# Patient Record
Sex: Female | Born: 1935 | Race: White | Hispanic: No | Marital: Married | Smoking: Never smoker
Health system: Southern US, Community
[De-identification: ages and names within clinical notes are randomized; demographics above are authoritative.]

## PROBLEM LIST (undated history)

## (undated) DIAGNOSIS — I10 Essential (primary) hypertension: Secondary | ICD-10-CM

## (undated) DIAGNOSIS — G309 Alzheimer's disease, unspecified: Secondary | ICD-10-CM

## (undated) DIAGNOSIS — R011 Cardiac murmur, unspecified: Secondary | ICD-10-CM

## (undated) DIAGNOSIS — E079 Disorder of thyroid, unspecified: Secondary | ICD-10-CM

## (undated) DIAGNOSIS — F0280 Dementia in other diseases classified elsewhere without behavioral disturbance: Secondary | ICD-10-CM

## (undated) HISTORY — DX: Alzheimer's disease, unspecified: G30.9

## (undated) HISTORY — DX: Dementia in other diseases classified elsewhere, unspecified severity, without behavioral disturbance, psychotic disturbance, mood disturbance, and anxiety: F02.80

---
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
## 1998-02-12 ENCOUNTER — Other Ambulatory Visit: Admission: RE | Admit: 1998-02-12 | Discharge: 1998-02-12 | Payer: Self-pay | Admitting: Family Medicine

## 1998-03-31 ENCOUNTER — Encounter: Admission: RE | Admit: 1998-03-31 | Discharge: 1998-06-29 | Payer: Self-pay | Admitting: Neurosurgery

## 1998-06-04 ENCOUNTER — Other Ambulatory Visit: Admission: RE | Admit: 1998-06-04 | Discharge: 1998-06-04 | Payer: Self-pay | Admitting: Family Medicine

## 1999-07-22 ENCOUNTER — Other Ambulatory Visit: Admission: RE | Admit: 1999-07-22 | Discharge: 1999-07-22 | Payer: Self-pay | Admitting: Family Medicine

## 2002-01-30 ENCOUNTER — Emergency Department (HOSPITAL_COMMUNITY): Admission: EM | Admit: 2002-01-30 | Discharge: 2002-01-30 | Payer: Self-pay | Admitting: Emergency Medicine

## 2002-01-30 ENCOUNTER — Encounter: Payer: Self-pay | Admitting: Emergency Medicine

## 2003-06-26 ENCOUNTER — Encounter: Payer: Self-pay | Admitting: Emergency Medicine

## 2003-06-26 ENCOUNTER — Emergency Department (HOSPITAL_COMMUNITY): Admission: EM | Admit: 2003-06-26 | Discharge: 2003-06-26 | Payer: Self-pay | Admitting: Emergency Medicine

## 2006-01-18 ENCOUNTER — Ambulatory Visit: Payer: Self-pay | Admitting: Family Medicine

## 2006-10-08 ENCOUNTER — Emergency Department (HOSPITAL_COMMUNITY): Admission: EM | Admit: 2006-10-08 | Discharge: 2006-10-08 | Payer: Self-pay | Admitting: Emergency Medicine

## 2006-10-16 ENCOUNTER — Inpatient Hospital Stay (HOSPITAL_COMMUNITY): Admission: EM | Admit: 2006-10-16 | Discharge: 2006-10-18 | Payer: Self-pay | Admitting: Emergency Medicine

## 2008-10-30 ENCOUNTER — Emergency Department (HOSPITAL_COMMUNITY): Admission: EM | Admit: 2008-10-30 | Discharge: 2008-10-30 | Payer: Self-pay | Admitting: Emergency Medicine

## 2010-02-22 ENCOUNTER — Emergency Department (HOSPITAL_COMMUNITY): Admission: EM | Admit: 2010-02-22 | Discharge: 2010-02-22 | Payer: Self-pay | Admitting: Emergency Medicine

## 2010-02-25 ENCOUNTER — Inpatient Hospital Stay (HOSPITAL_COMMUNITY): Admission: EM | Admit: 2010-02-25 | Discharge: 2010-02-27 | Payer: Self-pay | Admitting: Internal Medicine

## 2010-09-19 ENCOUNTER — Emergency Department (HOSPITAL_COMMUNITY): Admission: EM | Admit: 2010-09-19 | Discharge: 2010-09-19 | Payer: Self-pay | Admitting: Emergency Medicine

## 2011-01-09 ENCOUNTER — Emergency Department (HOSPITAL_COMMUNITY): Payer: Medicare Other

## 2011-01-09 ENCOUNTER — Emergency Department (HOSPITAL_COMMUNITY)
Admission: EM | Admit: 2011-01-09 | Discharge: 2011-01-09 | Disposition: A | Discharge status: Home / Self Care | Payer: Medicare Other | Attending: Emergency Medicine | Admitting: Emergency Medicine

## 2011-01-09 ENCOUNTER — Encounter (HOSPITAL_COMMUNITY): Payer: Self-pay

## 2011-01-09 DIAGNOSIS — R0789 Other chest pain: Secondary | ICD-10-CM | POA: Insufficient documentation

## 2011-01-09 DIAGNOSIS — I440 Atrioventricular block, first degree: Secondary | ICD-10-CM | POA: Insufficient documentation

## 2011-01-09 DIAGNOSIS — R0602 Shortness of breath: Secondary | ICD-10-CM | POA: Insufficient documentation

## 2011-01-09 DIAGNOSIS — I10 Essential (primary) hypertension: Secondary | ICD-10-CM | POA: Insufficient documentation

## 2011-01-09 DIAGNOSIS — F0390 Unspecified dementia without behavioral disturbance: Secondary | ICD-10-CM | POA: Insufficient documentation

## 2011-01-09 DIAGNOSIS — R1012 Left upper quadrant pain: Secondary | ICD-10-CM | POA: Insufficient documentation

## 2011-01-09 DIAGNOSIS — E7800 Pure hypercholesterolemia, unspecified: Secondary | ICD-10-CM | POA: Insufficient documentation

## 2011-01-09 DIAGNOSIS — E119 Type 2 diabetes mellitus without complications: Secondary | ICD-10-CM | POA: Insufficient documentation

## 2011-01-09 LAB — COMPREHENSIVE METABOLIC PANEL
BUN: 13 mg/dL (ref 6–23)
CO2: 25 mEq/L (ref 19–32)
Calcium: 8.8 mg/dL (ref 8.4–10.5)
Creatinine, Ser: 1.16 mg/dL (ref 0.4–1.2)
Sodium: 137 mEq/L (ref 135–145)

## 2011-01-09 LAB — URINE MICROSCOPIC-ADD ON

## 2011-01-09 LAB — POCT CARDIAC MARKERS
CKMB, poc: <1 ng/mL — ABNORMAL LOW (ref 1.0–8.0)
CKMB, poc: <1 ng/mL — ABNORMAL LOW (ref 1.0–8.0)
Myoglobin, poc: 79.7 ng/mL (ref 12–200)
Myoglobin, poc: 60.5 ng/mL (ref 12–200)

## 2011-01-09 LAB — URINALYSIS, ROUTINE W REFLEX MICROSCOPIC
Bilirubin Urine: NEGATIVE
Glucose, UA: NEGATIVE mg/dL
Hgb urine dipstick: NEGATIVE
Ketones, ur: NEGATIVE mg/dL
Nitrite: NEGATIVE
Specific Gravity, Urine: 1.009 (ref 1.005–1.030)

## 2011-01-09 LAB — DIFFERENTIAL
Lymphs Abs: 1.7 10*3/uL (ref 0.7–4.0)
Monocytes Absolute: 0.6 10*3/uL (ref 0.1–1.0)
Monocytes Relative: 8 % (ref 3–12)
Neutro Abs: 4.9 10*3/uL (ref 1.7–7.7)
Neutrophils Relative %: 68 % (ref 43–77)

## 2011-01-09 LAB — CBC
MCHC: 31.8 g/dL (ref 30.0–36.0)
MCV: 85.3 fL (ref 78.0–100.0)
RDW: 13.7 % (ref 11.5–15.5)

## 2011-01-09 MED ORDER — IOHEXOL 300 MG/ML  SOLN
100.0000 mL | Freq: Once | INTRAMUSCULAR | Status: AC | PRN
  Administered 2011-01-09: 100 mL via INTRAVENOUS

## 2011-01-11 LAB — URINE CULTURE
Colony Count: 60000
Culture  Setup Time: 201203040108

## 2011-01-19 LAB — POCT I-STAT, CHEM 8
Chloride: 102 mEq/L (ref 96–112)
Glucose, Bld: 165 mg/dL — ABNORMAL HIGH (ref 70–99)
Potassium: 4 mEq/L (ref 3.5–5.1)
Sodium: 137 mEq/L (ref 135–145)

## 2011-01-19 LAB — GLUCOSE, CAPILLARY: Glucose-Capillary: 217 mg/dL — ABNORMAL HIGH (ref 70–99)

## 2011-01-26 LAB — COMPREHENSIVE METABOLIC PANEL
ALT: 17 U/L (ref 0–35)
AST: 26 U/L (ref 0–37)
Albumin: 3.1 g/dL — ABNORMAL LOW (ref 3.5–5.2)
Albumin: 3 g/dL — ABNORMAL LOW (ref 3.5–5.2)
Alkaline Phosphatase: 83 U/L (ref 39–117)
Alkaline Phosphatase: 71 U/L (ref 39–117)
BUN: 15 mg/dL (ref 6–23)
Calcium: 9 mg/dL (ref 8.4–10.5)
Calcium: 8.9 mg/dL (ref 8.4–10.5)
Creatinine, Ser: 0.97 mg/dL (ref 0.4–1.2)
Glucose, Bld: 175 mg/dL — ABNORMAL HIGH (ref 70–99)
Potassium: 3.6 mEq/L (ref 3.5–5.1)
Total Bilirubin: 0.2 mg/dL — ABNORMAL LOW (ref 0.3–1.2)
Total Protein: 6 g/dL (ref 6.0–8.3)
Total Protein: 6 g/dL (ref 6.0–8.3)

## 2011-01-26 LAB — BASIC METABOLIC PANEL
BUN: 13 mg/dL (ref 6–23)
CO2: 23 mEq/L (ref 19–32)
CO2: 27 mEq/L (ref 19–32)
Calcium: 8.9 mg/dL (ref 8.4–10.5)
Chloride: 103 mEq/L (ref 96–112)
Creatinine, Ser: 0.84 mg/dL (ref 0.4–1.2)
Creatinine, Ser: 1 mg/dL (ref 0.4–1.2)
GFR calc Af Amer: >60 mL/min (ref >60)
GFR calc non Af Amer: 52 mL/min — ABNORMAL LOW (ref >60)
Glucose, Bld: 130 mg/dL — ABNORMAL HIGH (ref 70–99)
Potassium: 3.3 mEq/L — ABNORMAL LOW (ref 3.5–5.1)

## 2011-01-26 LAB — CBC
HCT: 35.3 % — ABNORMAL LOW (ref 36.0–46.0)
HCT: 31.3 % — ABNORMAL LOW (ref 36.0–46.0)
MCHC: 33.8 g/dL (ref 30.0–36.0)
MCHC: 33 g/dL (ref 30.0–36.0)
MCHC: 33.3 g/dL (ref 30.0–36.0)
MCV: 86.9 fL (ref 78.0–100.0)
MCV: 87.1 fL (ref 78.0–100.0)
Platelets: 177 10*3/uL (ref 150–400)
Platelets: 179 10*3/uL (ref 150–400)
RBC: 3.8 MIL/uL — ABNORMAL LOW (ref 3.87–5.11)
RDW: 14 % (ref 11.5–15.5)
WBC: 4.5 10*3/uL (ref 4.0–10.5)
WBC: 5.8 10*3/uL (ref 4.0–10.5)
WBC: 6.1 10*3/uL (ref 4.0–10.5)

## 2011-01-26 LAB — URINALYSIS, ROUTINE W REFLEX MICROSCOPIC
Glucose, UA: NEGATIVE mg/dL
Ketones, ur: NEGATIVE mg/dL
Protein, ur: NEGATIVE mg/dL
Specific Gravity, Urine: 1.011 (ref 1.005–1.030)

## 2011-01-26 LAB — DIFFERENTIAL
Eosinophils Relative: 3 % (ref 0–5)
Lymphocytes Relative: 38 % (ref 12–46)
Monocytes Absolute: 0.3 10*3/uL (ref 0.1–1.0)
Monocytes Relative: 7 % (ref 3–12)
Neutro Abs: 2.3 10*3/uL (ref 1.7–7.7)
Neutrophils Relative %: 51 % (ref 43–77)

## 2011-01-26 LAB — GLUCOSE, CAPILLARY
Glucose-Capillary: 142 mg/dL — ABNORMAL HIGH (ref 70–99)
Glucose-Capillary: 133 mg/dL — ABNORMAL HIGH (ref 70–99)
Glucose-Capillary: 146 mg/dL — ABNORMAL HIGH (ref 70–99)
Glucose-Capillary: 154 mg/dL — ABNORMAL HIGH (ref 70–99)
Glucose-Capillary: 156 mg/dL — ABNORMAL HIGH (ref 70–99)
Glucose-Capillary: 173 mg/dL — ABNORMAL HIGH (ref 70–99)

## 2011-01-26 LAB — URINALYSIS, MICROSCOPIC ONLY
Bilirubin Urine: NEGATIVE
Ketones, ur: NEGATIVE mg/dL
Leukocytes, UA: NEGATIVE
Nitrite: NEGATIVE
Protein, ur: NEGATIVE mg/dL
Urobilinogen, UA: 0.2 mg/dL (ref 0.0–1.0)
pH: 5.5 (ref 5.0–8.0)

## 2011-01-26 LAB — FOLATE RBC: RBC Folate: 299 ng/mL (ref 180–600)

## 2011-01-26 LAB — VITAMIN B12: Vitamin B-12: 171 pg/mL — ABNORMAL LOW (ref 211–911)

## 2011-01-26 LAB — RPR: RPR Ser Ql: NONREACTIVE

## 2011-01-26 LAB — CK: Total CK: 175 U/L (ref 7–177)

## 2011-03-26 NOTE — Discharge Summary (Signed)
[name], [name]               ACCOUNT NO.:  [id number]   MEDICAL RECORD NO.:  [id number]          PATIENT TYPE:  INP   LOCATION:  1513                         FACILITY:  [facility]   PHYSICIAN:  Kari Baars, M.D.  DATE OF BIRTH:  18-Aug-1916   DATE OF ADMISSION:  10/16/2006  DATE OF DISCHARGE:  10/18/2006                               DISCHARGE SUMMARY   DISCHARGE DIAGNOSES:  1. Severe back pain secondary to an acute T12 fracture due to fall.  2. Severe constipation, secondary to narcotic use.  3. Osteoporosis.  4. Dementia, moderate.  5. Type 2 diabetes.  6. Hyperlipidemia.  7. Hypothyroidism  8. Status post partial hysterectomy (1946).   DISCHARGE MEDICATIONS:  1. Miacalcin 1 spray nasally daily, alternating nostrils.  2. Celebrex  200 mg daily.  3. Vicodin 1 p.o. q.6 h p.r.n. moderate to severe pain (#30 with zero      refills).  4. Glipizide 10 mg daily.  5. Glucophage XR 500 mg b.i.d.  6. Synthroid 50 mcg daily.  7. Zocor 20 mg daily.   HOSPITAL PROCEDURES:  1. Acute abdominal series (December 9) compression fracture of the      superior endplate of T12.  Calcified lesion in the left mid      abdomen, indeterminate.  2. MRI of the lumbar spine (December 10) acute or subacute superior      endplate compression fracture of T12 with loss of height anteriorly      of 1/3. Mild posterior bowing in the posterior margin of the      vertebral body indents the ventral subarachnoid space but does not      appear to compressive the cord.   HISTORY OF PRESENT ILLNESS:  For full details, please see dictated  history and physical by Dr. Waynard Edwards.  Briefly, Anne [name] is an 77 year old  white female with a history of type 2 diabetes, moderate dementia who I  had seen on one occasion in June 2007.  She missed her follow-up visit  in September 2007.  She represented to the emergency department on  December 9 with the complaint of severe back pain and constipation.  The  patient was  initially seen in the emergency department on December 1  with a complaint of back pain after falling backwards on the tile floor.  Lumbar x-rays at that time were negative for fracture.  She was  discharged home on hydrocodone and a muscle relaxant.  Over the  following week, she continued to have severe back pain and worsening  constipation.  She called Dr. Waynard Edwards who recommended stool softeners,  MiraLax, and fiber supplementation.  She then presented to the emergency  department several hours later for evaluation.  Due to the severity of  her pain and constipation, she was admitted for further management.   HOSPITAL COURSE:  The patient was admitted to a medical bed.  An acute  abdominal series was negative for any intra-abdominal process.  It did  show a T12 fracture.  To further evaluate the severity of her pain  particularly in the low back, an MRI of  the lumbar spine was performed.  This confirmed an acute/subacute T12 compression fracture with no  additional fractures.  The patient's fracture pain was treated with  Celebrex, Miacalcin spray, and Vicodin as needed.  Morphine was  available but she did not require this.  Within 24 hours of starting  this regimen, the patient's pain improved significantly.  Physical  therapy and occupational therapy were consulted and had no  recommendations for ongoing therapy.  They did not feel that a TLSO  brace was necessary.  The husband then called stating he was ready for  her to be discharged on December 11.  Therefore, she was discharged home  with plans for outpatient follow-up and treatment of her osteoporosis.   DISCHARGE LABS:  CMET significant for sodium 135, potassium 3.4,  chloride 104, bicarb 21, BUN 14, creatinine 1.0, glucose 213.  Liver  function tests normal.  Hemoglobin A1c 6.7.  TSH 4.862, 25-hydroxy  vitamin D is pending.   DISCHARGE INSTRUCTIONS:  The patient's husband was instructed to follow  up within 1 week to  discuss ongoing treatment for osteoporosis to  prevent future fractures.  We will need to do a bone density scan to  evaluate. She may be a Forteo candidate, although this is her first  fracture.  He was instructed to increase fluid intake and use the stool  softeners and MiraLax over-the-counter as needed for her bowels.   HOSPITAL FOLLOW-UP:  She will follow up with Dr. Clelia Croft within a week.   DISPOSITION:  To home with her husband.      Kari Baars, M.D.  Electronically Signed     WS/MEDQ  D:  10/19/2006  T:  10/19/2006  Job:  [id number]

## 2011-03-26 NOTE — H&P (Signed)
Anne [name], Anne [name]               ACCOUNT NO.:  [id number]   MEDICAL RECORD NO.:  [id number]          PATIENT TYPE:  INP   LOCATION:  1513                         [facility]ACILITY:  [facility]   PHYSICIAN:  Mark A. Perini, M.D.   DATE OF BIRTH:  1916-05-21   DATE OF ADMISSION:  10/16/2006  DATE OF DISCHARGE:                              HISTORY & PHYSICAL   CHIEF COMPLAINT:  Constipation and back pain.   HISTORY OF PRESENT ILLNESS:  Anne [name] is an 77 year old female with past  medical history as listed below. She fell backwards on a tile floor last  week. She presented to emergency room. Plain x-ray at that time showed  no history. She was sent home on hydrocodone and a muscle relaxer. Over  the last few days, she developed severe constipation and obstipation and  has ongoing severe back pain. She will require admission for management  of these issues.   PAST MEDICAL HISTORY:  1. Type 2 diabetes mellitus.  2. Hypothyroidism.  3. Hyperlipidemia.  4. No history of myocardial infarction, coronary artery disease, or      stroke.  5. Clinically, no history of fracture previously.  6. Partial hysterectomy in 1946.   ALLERGIES:  NO KNOWN DRUG ALLERGIES.   MEDICATIONS:  1. Glipizide 10 mg daily.  2. Glucophage XR 500 mg b.i.d.  3. Synthroid 50 mcg daily.  4. Zocor 20 mg daily.  5. Hydrocodone and a muscle relaxer.   SOCIAL HISTORY:  Her husband's name is Chrissie Noa. She has been married  since 1946. She has a 27 year old adopted son, named [place]. No tobacco,  no alcohol, no drug use.   FAMILY HISTORY:  Noncontributory.   REVIEW OF SYSTEMS:  She has had a lot of pain in her back since her  fall. She has been constipated and has had no bowel movement in the last  3 days. She attempted some remedies at home with no success. She denies  any nausea, vomiting, or fevers. She denies any genitourinary problems.  No burning or blood. With any movement, she has 10 out of 10 pain in her  back.   PHYSICAL EXAMINATION:  VITAL SIGNS:  Temperature 96.0, blood pressure  137/79, pulse 106, respiratory rate 16, 97% saturation on room air.  GENERAL:  She is semi-supine in no acute distress. She is alert and  responds appropriately.  LUNGS:  Clear to auscultation bilaterally with no wheezes, rales, or  rhonchi.  HEART:  Regular rate and rhythm with a 2 over 6 murmur at the left and  right sternal border.  ABDOMEN:  Soft, nontender, and nondistended. No masses or  hepatosplenomegaly.  EXTREMITIES:  There is no edema.  NECK:  There is no JVD.  SKIN:  No pallor, no icterus.  NEUROLOGIC:  She is a fair historian.   LABORATORY DATA:  There is no data currently.   Lumbar spine x-ray from October 08, 2006 showed osteopenia. Negative for  fracture or degenerative disk changes at L2, 3, and atherosclerosis of  the aorta.   ASSESSMENT/PLAN:  Obstipation/constipation/stool impaction. The ER  physician did manually dis-impact her  somewhat. We will treat her  MiraLax, Colace, and a soap suds enema. If this fails, she may need  further disimpaction. We will check an acute abdominal series. She has  ongoing back pain that is severe. We will check a MRI to rule out a more  occult type of fracture. I will continue her home medications. She is a  FULL CODE status. We will obtain PT and OT evaluation.           ______________________________  Redge Gainer Waynard Edwards, M.D.     MAP/MEDQ  D:  10/16/2006  T:  10/16/2006  Job:  [id number]   cc:   Kari Baars, M.D.  Fax: [phone]

## 2013-04-03 ENCOUNTER — Emergency Department (HOSPITAL_COMMUNITY): Payer: Medicare Other

## 2013-04-03 ENCOUNTER — Encounter (HOSPITAL_COMMUNITY): Payer: Self-pay | Admitting: Emergency Medicine

## 2013-04-03 ENCOUNTER — Inpatient Hospital Stay (HOSPITAL_COMMUNITY)
Admission: EM | Admit: 2013-04-03 | Discharge: 2013-04-06 | DRG: 690 | Disposition: A | Discharge status: Skilled Nursing Facility | Payer: Medicare Other | Attending: Internal Medicine | Admitting: Internal Medicine

## 2013-04-03 DIAGNOSIS — F0280 Dementia in other diseases classified elsewhere without behavioral disturbance: Secondary | ICD-10-CM | POA: Diagnosis present

## 2013-04-03 DIAGNOSIS — R4182 Altered mental status, unspecified: Secondary | ICD-10-CM

## 2013-04-03 DIAGNOSIS — G309 Alzheimer's disease, unspecified: Secondary | ICD-10-CM | POA: Diagnosis present

## 2013-04-03 DIAGNOSIS — Y92009 Unspecified place in unspecified non-institutional (private) residence as the place of occurrence of the external cause: Secondary | ICD-10-CM

## 2013-04-03 DIAGNOSIS — N390 Urinary tract infection, site not specified: Principal | ICD-10-CM | POA: Diagnosis present

## 2013-04-03 DIAGNOSIS — M810 Age-related osteoporosis without current pathological fracture: Secondary | ICD-10-CM | POA: Diagnosis present

## 2013-04-03 DIAGNOSIS — I10 Essential (primary) hypertension: Secondary | ICD-10-CM | POA: Diagnosis present

## 2013-04-03 DIAGNOSIS — E559 Vitamin D deficiency, unspecified: Secondary | ICD-10-CM | POA: Diagnosis present

## 2013-04-03 DIAGNOSIS — B961 Klebsiella pneumoniae [K. pneumoniae] as the cause of diseases classified elsewhere: Secondary | ICD-10-CM | POA: Diagnosis present

## 2013-04-03 DIAGNOSIS — R5383 Other fatigue: Secondary | ICD-10-CM | POA: Diagnosis present

## 2013-04-03 DIAGNOSIS — E785 Hyperlipidemia, unspecified: Secondary | ICD-10-CM | POA: Diagnosis present

## 2013-04-03 DIAGNOSIS — R531 Weakness: Secondary | ICD-10-CM | POA: Diagnosis present

## 2013-04-03 DIAGNOSIS — E039 Hypothyroidism, unspecified: Secondary | ICD-10-CM | POA: Diagnosis present

## 2013-04-03 DIAGNOSIS — Z79899 Other long term (current) drug therapy: Secondary | ICD-10-CM

## 2013-04-03 DIAGNOSIS — R5381 Other malaise: Secondary | ICD-10-CM | POA: Diagnosis present

## 2013-04-03 DIAGNOSIS — M79609 Pain in unspecified limb: Secondary | ICD-10-CM | POA: Diagnosis present

## 2013-04-03 DIAGNOSIS — E860 Dehydration: Secondary | ICD-10-CM | POA: Diagnosis present

## 2013-04-03 DIAGNOSIS — Z66 Do not resuscitate: Secondary | ICD-10-CM | POA: Diagnosis present

## 2013-04-03 DIAGNOSIS — R627 Adult failure to thrive: Secondary | ICD-10-CM | POA: Diagnosis present

## 2013-04-03 DIAGNOSIS — R05 Cough: Secondary | ICD-10-CM | POA: Diagnosis present

## 2013-04-03 DIAGNOSIS — E119 Type 2 diabetes mellitus without complications: Secondary | ICD-10-CM | POA: Diagnosis present

## 2013-04-03 DIAGNOSIS — W010XXA Fall on same level from slipping, tripping and stumbling without subsequent striking against object, initial encounter: Secondary | ICD-10-CM | POA: Diagnosis present

## 2013-04-03 DIAGNOSIS — M79604 Pain in right leg: Secondary | ICD-10-CM | POA: Diagnosis present

## 2013-04-03 HISTORY — DX: Disorder of thyroid, unspecified: E07.9

## 2013-04-03 HISTORY — DX: Essential (primary) hypertension: I10

## 2013-04-03 LAB — COMPREHENSIVE METABOLIC PANEL
ALT: 13 U/L (ref 0–35)
AST: 21 U/L (ref 0–37)
Albumin: 3.6 g/dL (ref 3.5–5.2)
Calcium: 9.9 mg/dL (ref 8.4–10.5)
GFR calc Af Amer: 61 mL/min — ABNORMAL LOW (ref >90)
Glucose, Bld: 179 mg/dL — ABNORMAL HIGH (ref 70–99)
Sodium: 133 mEq/L — ABNORMAL LOW (ref 135–145)
Total Protein: 7.6 g/dL (ref 6.0–8.3)

## 2013-04-03 LAB — URINALYSIS, ROUTINE W REFLEX MICROSCOPIC
Specific Gravity, Urine: 1.015 (ref 1.005–1.030)
Urobilinogen, UA: 0.2 mg/dL (ref 0.0–1.0)
pH: 7 (ref 5.0–8.0)

## 2013-04-03 LAB — CBC
MCH: 26.9 pg (ref 26.0–34.0)
MCHC: 33 g/dL (ref 30.0–36.0)
Platelets: 204 10*3/uL (ref 150–400)
RDW: 13.5 % (ref 11.5–15.5)

## 2013-04-03 LAB — GLUCOSE, CAPILLARY: Glucose-Capillary: 203 mg/dL — ABNORMAL HIGH (ref 70–99)

## 2013-04-03 MED ORDER — ENOXAPARIN SODIUM 30 MG/0.3ML ~~LOC~~ SOLN
30.0000 mg | SUBCUTANEOUS | Status: DC
  Administered 2013-04-03 – 2013-04-05 (×3): 30 mg via SUBCUTANEOUS
  Filled 2013-04-03 (×4): qty 0.3

## 2013-04-03 MED ORDER — INSULIN ASPART 100 UNIT/ML ~~LOC~~ SOLN
0.0000 [IU] | Freq: Every day | SUBCUTANEOUS | Status: DC
  Administered 2013-04-03: 100 [IU] via SUBCUTANEOUS

## 2013-04-03 MED ORDER — ACETAMINOPHEN 650 MG RE SUPP: 650.0000 mg | Freq: Four times a day (QID) | RECTAL | Status: DC | PRN

## 2013-04-03 MED ORDER — DEXTROSE 5 % IV SOLN
1.0000 g | INTRAVENOUS | Status: DC
  Administered 2013-04-03 – 2013-04-05 (×3): 1 g via INTRAVENOUS
  Filled 2013-04-03 (×4): qty 10

## 2013-04-03 MED ORDER — ACETAMINOPHEN 325 MG PO TABS: 650.0000 mg | ORAL_TABLET | Freq: Four times a day (QID) | ORAL | Status: DC | PRN

## 2013-04-03 MED ORDER — TRAMADOL HCL 50 MG PO TABS: 50.0000 mg | ORAL_TABLET | Freq: Four times a day (QID) | ORAL | Status: DC | PRN

## 2013-04-03 MED ORDER — SODIUM CHLORIDE 0.9 % IV SOLN
INTRAVENOUS | Status: AC
  Administered 2013-04-03: 16:00:00 via INTRAVENOUS

## 2013-04-03 MED ORDER — LEVOTHYROXINE SODIUM 75 MCG PO TABS
75.0000 ug | ORAL_TABLET | Freq: Every day | ORAL | Status: DC
  Administered 2013-04-04 – 2013-04-06 (×3): 75 ug via ORAL
  Filled 2013-04-03 (×4): qty 1

## 2013-04-03 MED ORDER — INSULIN ASPART 100 UNIT/ML ~~LOC~~ SOLN
0.0000 [IU] | Freq: Three times a day (TID) | SUBCUTANEOUS | Status: DC
  Administered 2013-04-04 (×3): 1 [IU] via SUBCUTANEOUS
  Administered 2013-04-05 – 2013-04-06 (×2): 2 [IU] via SUBCUTANEOUS

## 2013-04-03 MED ORDER — METFORMIN HCL 500 MG PO TABS
500.0000 mg | ORAL_TABLET | Freq: Two times a day (BID) | ORAL | Status: DC
  Administered 2013-04-04 – 2013-04-06 (×4): 500 mg via ORAL
  Filled 2013-04-03 (×7): qty 1

## 2013-04-03 MED ORDER — LORAZEPAM 2 MG/ML IJ SOLN: 0.5000 mg | INTRAMUSCULAR | Status: DC | PRN

## 2013-04-03 MED ORDER — SODIUM CHLORIDE 0.9 % IV SOLN
INTRAVENOUS | Status: DC
  Administered 2013-04-04: 02:00:00 via INTRAVENOUS
  Administered 2013-04-05: 75 mL/h via INTRAVENOUS
  Administered 2013-04-05: 23:00:00 via INTRAVENOUS

## 2013-04-03 NOTE — ED Notes (Signed)
Family states that pt is unable to walk.  Family states they have to hold her up to get her from place to place per norm.

## 2013-04-03 NOTE — ED Provider Notes (Signed)
History     CSN: [id number]  Arrival date & time 04/03/13  0840   First MD Initiated Contact with Patient 04/03/13 [phone]      Chief Complaint  Patient presents with  . Fatigue    (Consider location/radiation/quality/duration/timing/severity/associated sxs/prior treatment) The history is provided by the patient. The history is limited by the condition of the patient.  pt from home, hx dementia, htn, noted by family to be generally weak for the past couple days. Poor po intake. Recent non productive cough, ?chest congestion per family. No fevers. Fell last pm when going to bathroom, assisted back to bed by spouse. No loc or head injury.  pts level of alertness/ms similar to prior ?increased confusion/weakness in past 1-2 days. Level 5 caveat - dementia   Past Medical History  Diagnosis Date  . Diabetes mellitus   . Dementia   . Thyroid disease   . Hypertension     History reviewed. No pertinent past surgical history.  History reviewed. No pertinent family history.  History  Substance Use Topics  . Smoking status: Never Smoker   . Smokeless tobacco: Not on file  . Alcohol Use: No    OB History   Grav Para Term Preterm Abortions TAB SAB Ect Mult Living                  Review of Systems  Unable to perform ROS: Dementia  Constitutional: Negative for fever.  Gastrointestinal: Negative for vomiting.  level 5 caveat  Allergies  Review of patient's allergies indicates not on file.  Home Medications  No current outpatient prescriptions on file.  BP 185/77  Pulse 73  Resp 16  SpO2 95%  Physical Exam  Nursing note and vitals reviewed. Constitutional: No distress.  Very thin, frail appearing  HENT:  Head: Atraumatic.  Mouth/Throat: Oropharynx is clear and moist.  Eyes: Conjunctivae are normal. Pupils are equal, round, and reactive to light. No scleral icterus.  Neck: Neck supple. No tracheal deviation present. No thyromegaly present.  No bruit  Cardiovascular:  Normal rate, regular rhythm, normal heart sounds and intact distal pulses.   Pulmonary/Chest: Effort normal and breath sounds normal. No respiratory distress.  Abdominal: Soft. Normal appearance and bowel sounds are normal. She exhibits no distension and no mass. There is no tenderness. There is no rebound and no guarding.  Genitourinary:  No cva tenderness  Musculoskeletal: Normal range of motion. She exhibits no edema and no tenderness.  CTLS spine, non tender, aligned, no step off. Good rom bil extremities without pain or focal bony tenderness. Distal pulses palp.   Neurological: She is alert.  Alert, content, confused.  Makes purposeful movements w bil extremities, follows simple commands. No facial asymmetry or droop.   Skin: Skin is warm and dry. No rash noted. She is not diaphoretic.  Psychiatric: She has a normal mood and affect.    ED Course  Procedures (including critical care time)   Results for orders placed during the hospital encounter of 04/03/13  CBC      Result Value Range   WBC 8.0  4.0 - 10.5 K/uL   RBC 4.57  3.87 - 5.11 MIL/uL   Hemoglobin 12.3  12.0 - 15.0 g/dL   HCT 47.8  29.5 - 62.1 %   MCV 81.6  78.0 - 100.0 fL   MCH 26.9  26.0 - 34.0 pg   MCHC 33.0  30.0 - 36.0 g/dL   RDW 30.8  65.7 - 84.6 %  Platelets 204  150 - 400 K/uL  COMPREHENSIVE METABOLIC PANEL      Result Value Range   Sodium 133 (*) 135 - 145 mEq/L   Potassium 3.7  3.5 - 5.1 mEq/L   Chloride 95 (*) 96 - 112 mEq/L   CO2 26  19 - 32 mEq/L   Glucose, Bld 179 (*) 70 - 99 mg/dL   BUN 19  6 - 23 mg/dL   Creatinine, Ser 2.13  0.50 - 1.10 mg/dL   Calcium 9.9  8.4 - 08.6 mg/dL   Total Protein 7.6  6.0 - 8.3 g/dL   Albumin 3.6  3.5 - 5.2 g/dL   AST 21  0 - 37 U/L   ALT 13  0 - 35 U/L   Alkaline Phosphatase 98  39 - 117 U/L   Total Bilirubin 0.4  0.3 - 1.2 mg/dL   GFR calc non Af Amer 53 (*) >90 mL/min   GFR calc Af Amer 61 (*) >90 mL/min  TROPONIN I      Result Value Range   Troponin I <0.30   <0.30 ng/mL  URINALYSIS, ROUTINE W REFLEX MICROSCOPIC      Result Value Range   Color, Urine YELLOW  YELLOW   APPearance CLOUDY (*) CLEAR   Specific Gravity, Urine 1.015  1.005 - 1.030   pH 7.0  5.0 - 8.0   Glucose, UA NEGATIVE  NEGATIVE mg/dL   Hgb urine dipstick SMALL (*) NEGATIVE   Bilirubin Urine NEGATIVE  NEGATIVE   Ketones, ur 15 (*) NEGATIVE mg/dL   Protein, ur 30 (*) NEGATIVE mg/dL   Urobilinogen, UA 0.2  0.0 - 1.0 mg/dL   Nitrite POSITIVE (*) NEGATIVE   Leukocytes, UA MODERATE (*) NEGATIVE  URINE MICROSCOPIC-ADD ON      Result Value Range   Squamous Epithelial / LPF RARE  RARE   WBC, UA 7-10  <3 WBC/hpf   Bacteria, UA MANY (*) RARE   Dg Chest 2 View  04/03/2013   *RADIOLOGY REPORT*  Clinical Data: Pain, confused.  CHEST - 2 VIEW  Comparison: 01/09/2011  Findings: Heart is borderline in size.  Tortuosity of the thoracic aorta.  Bibasilar densities likely reflect scarring.  No acute opacities.  No effusions.  No acute bony abnormality.  IMPRESSION: No acute cardiopulmonary disease.   Original Report Authenticated By: Charlett Nose, M.D.   Ct Head Wo Contrast  04/03/2013   *RADIOLOGY REPORT*  Clinical Data: Fall, weakness.  Dimension.  CT HEAD WITHOUT CONTRAST  Technique:  Contiguous axial images were obtained from the base of the skull through the vertex without contrast.  Comparison: 02/25/2010  Findings: Atrophy, chronic microvascular changes.  Old lacunar infarct in the left basal ganglia. No acute intracranial abnormality.  Specifically, no hemorrhage, hydrocephalus, mass lesion, acute infarction, or significant intracranial injury.  No acute calvarial abnormality. Visualized paranasal sinuses and mastoids clear.  Orbital soft tissues unremarkable.  IMPRESSION: No acute intracranial abnormality.  Atrophy, chronic microvascular disease.  Old left basal ganglia lacunar infarct.   Original Report Authenticated By: Charlett Nose, M.D.       MDM  Iv ns. Labs. Cxr.  Reviewed  nursing notes and prior charts for additional history.    Date: 04/03/2013  Rate: 70  Rhythm: normal sinus rhythm  QRS Axis: normal  Intervals: PR prolonged  ST/T Wave abnormalities: nonspecific ST/T changes  Conduction Disutrbances:first-degree A-V block   Narrative Interpretation:   Old EKG Reviewed: changes noted  Iv ns bolus.  ua positive.  u cx sent. Rocephin iv.  Recheck no change from prior.  Pt unable to ambulate even w 2 staff assist - family notes change from baseline.  Unable to care for pt given weakness, poor po intake, uti/fever.   Dr Clelia Croft called-  Will admit.        Suzi Roots, MD 04/03/13 1336

## 2013-04-03 NOTE — H&P (Signed)
PCP:   No primary provider on file.   Chief Complaint:  weakness  HPI: [name] is a 79 year old white female with a history of severe Alzheimer's dementia, hypothyroidism, and diabetes who presented to the emergency department with weakness. The patient has had a long history of progressive severe Alzheimer's dementia. She was previously on hospice care but was discharged due to stability of her disease. At baseline, her husband is able to care for her at home with 10 hours of in-home care. He has been adamant about keeping her at home. She ambulates with a seated walker and is able to transfer with assistance.  Over the past several days, her husband has noticed increased weakness and difficulty transferring. Last night, she went to the restroom 3 times and was unable to void. The third time she fell when trying to transfer. No apparent injuries. Today, she is unable to bear weight due to generalized weakness prompting them to bring her to the emergency department. In the emergency department she had a head CT that showed no acute findings. Laboratory evaluation was significant for urinary tract infection. Of note, she has recently stopped taking her Synthroid as her husband did not feel that it was helping. There've been no other medication changes. She uses Ativan sparingly.      Review of Systems:  Review of Systems - All systems were reviewed with patient and her husband and are negative except in history of present illness. Past Medical History: Past Medical History  Diagnosis Date  . Diabetes mellitus   . Dementia   . Thyroid disease   . Hypertension    DM2.  Hyperlipidemia.  Hypothyroid.  Vitamin D deficiency.  HTN.  Mod-Severe AS (2008) Dementia.  Osteoporosis.   Past Surgical History  Procedure Laterality Date  . Abdominal hysterectomy  1946    partial   T12 compression fx.  USO.  Medications: Prior to Admission medications   Medication Sig Start Date End Date Taking?  Authorizing Provider  levothyroxine (SYNTHROID, LEVOTHROID) 75 MCG tablet Take 75 mcg by mouth daily before breakfast.   Yes Historical Provider, MD  LORazepam (ATIVAN) 0.5 MG tablet Take 0.5 mg by mouth every 8 (eight) hours.   Yes Historical Provider, MD  metFORMIN (GLUCOPHAGE) 500 MG tablet Take 500 mg by mouth 2 (two) times daily with a meal.   Yes Historical Provider, MD  traMADol (ULTRAM) 50 MG tablet Take 50 mg by mouth every 6 (six) hours as needed for pain.   Yes Historical Provider, MD    Allergies:  No Known Allergies  Social History: Married, 1 son, 2 GC. Lives with husband.  Has in home aide for 3 hours a day 6th grade education. Retired from [employer], [employer]. [place]   Family History: Father died in accident.  Mother died of CVA.  Physical Exam: Filed Vitals:   04/03/13 0840 04/03/13 0850 04/03/13 1028 04/03/13 1446  BP:  185/77  156/83  Pulse:  73  81  Temp:   100.2 F (37.9 C) 98 F (36.7 C)  TempSrc:   Rectal Axillary  Resp:  16  18  Height:    5\' 3"  (1.6 m)  Weight:    43.9 kg (96 lb 12.5 oz)  SpO2: 97% 95%  93%   General appearance: Pleasantly demented in no acute distress. Head: Normocephalic, without obvious abnormality, atraumatic Eyes: conjunctivae/corneas clear. PERRL, EOM's intact.  Nose: Nares normal. Septum midline. Mucosa normal. No drainage or sinus tenderness. Throat: lips, mucosa, and tongue  normal; teeth and gums normal Neck: no adenopathy, no carotid bruit, no JVD and thyroid not enlarged, symmetric, no tenderness/mass/nodules Resp: clear to auscultation bilaterally Cardio: regular rate and rhythm with grade 3/6 SEM GI: soft, non-tender; bowel sounds normal; no masses,  no organomegaly Extremities: extremities normal, atraumatic, no cyanosis or edema Pulses: 2+ and symmetric Lymph nodes: Cervical adenopathy: no cervical lymphadenopathy Neurologic: Follows directions, no focal weakness    Labs on Admission:   Recent Labs  04/03/13 0940   NA 133*  K 3.7  CL 95*  CO2 26  GLUCOSE 179*  BUN 19  CREATININE 0.90  CALCIUM 9.9    Recent Labs  04/03/13 0940  AST 21  ALT 13  ALKPHOS 98  BILITOT 0.4  PROT 7.6  ALBUMIN 3.6     Recent Labs  04/03/13 0940  WBC 8.0  HGB 12.3  HCT 37.3  MCV 81.6  PLT 204    Recent Labs  04/03/13 0940  TROPONINI <0.30    Radiological Exams on Admission: Dg Chest 2 View  04/03/2013   *RADIOLOGY REPORT*  Clinical Data: Pain, confused.  CHEST - 2 VIEW  Comparison: 01/09/2011  Findings: Heart is borderline in size.  Tortuosity of the thoracic aorta.  Bibasilar densities likely reflect scarring.  No acute opacities.  No effusions.  No acute bony abnormality.  IMPRESSION: No acute cardiopulmonary disease.   Original Report Authenticated By: Charlett Nose, M.D.   Ct Head Wo Contrast  04/03/2013   *RADIOLOGY REPORT*  Clinical Data: Fall, weakness.  Dimension.  CT HEAD WITHOUT CONTRAST  Technique:  Contiguous axial images were obtained from the base of the skull through the vertex without contrast.  Comparison: 02/25/2010  Findings: Atrophy, chronic microvascular changes.  Old lacunar infarct in the left basal ganglia. No acute intracranial abnormality.  Specifically, no hemorrhage, hydrocephalus, mass lesion, acute infarction, or significant intracranial injury.  No acute calvarial abnormality. Visualized paranasal sinuses and mastoids clear.  Orbital soft tissues unremarkable.  IMPRESSION: No acute intracranial abnormality.  Atrophy, chronic microvascular disease.  Old left basal ganglia lacunar infarct.   Original Report Authenticated By: Charlett Nose, M.D.   Orders placed during the hospital encounter of 04/03/13  . EKG 12-LEAD  . EKG 12-LEAD  . EKG 12-LEAD  . EKG 12-LEAD    Assessment/Plan 1. Failure to thrive/generalized weakness-this is likely secondary to urinary tract infection. Discontinuation of Synthroid could play a role. We'll treat with IV Rocephin pending urine and blood  culture data. Gentle hydration. Physical therapy/occupational therapy evaluation. 2. Severe Alzheimer's dementia-her husband has not been injected and treatment in the past. Continue supportive therapy.  Consider restarting hospice care 3. diabetes mellitus type 2- continue metformin and cover with sliding scale insulin.  4. Hypothyroidism- will resume Synthroid. This could contribute to her generalized weakness. 5. Disposition-her husband would like to continue to care for her at home. It is not clear that he will be able to do this at this point. Physical therapy/occupational therapy evaluation. Consider short-term skilled nursing facility for rehabilitation and consideration of long-term care. Anticipate discharge in 2-3 days to home with home health and in-home aides or to skilled nursing facility. 6. Burnadette Peter is DO NOT RESUSCITATE  Arnelle Nale,W DOUGLAS 04/03/2013, 5:23 PM

## 2013-04-03 NOTE — ED Notes (Signed)
[facility].  Pt from home.  PMH dementia.  Family states pt that since yesterday pt has had decreased mobility and complains of generalized pain.  Family stated to EMS that pt normally gets up 3x a night to use restroom, but did not get up last night.  Pt able to urinate.  Pt oriented per norm.  EMs stated that pt had congested cough in truck. Pt has no complaints at this time.

## 2013-04-03 NOTE — ED Notes (Signed)
Family states pt has also been dragging R foot for past couple of days. Also states that pt has also had a dry cough at home.

## 2013-04-03 NOTE — Progress Notes (Signed)
CM noted CM consult and spoke with pt and husband in [facility] prior to leaving [facility] Husband hard of hearing Pt noted to be playing with her bed sheets  CM reviewed in details medicare guidelines, home health [facility]) (length of stay in home, types of [facility] staff available, coverage, primary caregiver, up to 24 hrs before services may be started) with pt husband He confirms he has an aide coming in from [facility] but will need other assistance   Discussed pt to be further evaluated by unit therapists (PT/OT) for recommendation of level of care and share this with attending MD and unit CM    Choice offered to / List presented to HOME HEALTH AGENCIES SERVING [place]   Agencies that are Medicare-Certified and are affiliated with The [facility] Home Health Agency  Telephone Number Address  [facility]   The [facility] has ownership interest in this company; however, you are under no obligation to use this agency. [phone] or  [phone] [address] [place], [place] [address] [email or web address]   Agencies that are Medicare-Certified and are not affiliated with The [facility] Agency Telephone Number Address  [facility] [phone] Fax [phone] [address] [place], [place]  [address] [email or web address]  [facility] [phone] or [phone] Fax [phone] [address] [place], [place] [address] [email or web address]  [facility] [phone] Fax [phone] [address] [place], [place] [address] [email or web address]  [facility] [phone] Fax [phone] [address] [place], [place]  [address] [email or web address]  [facility] [phone] Fax [phone] [address], Suite [place], [place] [address] [email or web address]  [facility] Services of [facility] [phone] [address] [place], [place] [address] [email or web address]  [facility] [phone]  [address] [place], [place] [address] [email or web address]  [facility] [phone] or [phone] Fax number [phone] [address], [address] [place], [place]  [address] [email or web address]  [facility] [phone] Fax [phone] [address] [place], [place]  [address]  [facility]  [phone] Fax [phone] [address] [place], [place] [address] [email or web address]

## 2013-04-04 LAB — URINE CULTURE

## 2013-04-04 LAB — GLUCOSE, CAPILLARY
Glucose-Capillary: 145 mg/dL — ABNORMAL HIGH (ref 70–99)
Glucose-Capillary: 143 mg/dL — ABNORMAL HIGH (ref 70–99)
Glucose-Capillary: 125 mg/dL — ABNORMAL HIGH (ref 70–99)

## 2013-04-04 LAB — HEMOGLOBIN A1C: Mean Plasma Glucose: 146 mg/dL — ABNORMAL HIGH (ref <117)

## 2013-04-04 NOTE — Progress Notes (Signed)
Occupational Therapy Evaluation Patient Details Name: Anne [name] MRN: [id number] DOB: 1916/11/06 Today's Date: 04/04/2013 Time: 7829-5621 OT Time Calculation (min): 32 min  OT Assessment / Plan / Recommendation Clinical Impression  79 yo female admitted with UTI, FTT, weakness. Hx of dementia. PTA, pt required assistance with all ADL and husband assisted with ambulation with HHA. Pt has PCA @ 2hrs/day 5 days/wk. PTA, pt had more difficulty with ambulation and became weaker and had a fall in the bathroom. On eval, pt required +2 for safe ambulation of short distance. Pt "favoring RLE" and c/o pain. Rec further assessment of RLE. Do not feel that husband is capable of taking care of pt at this level. Will return for another visit to have husband walk with pt in order for husband to see level of physical assistance needed to care for his wife at this time.  Pt will benefit from skilled OT services to facilitate D/C to next venue due to below deficits.    OT Assessment  Patient needs continued OT Services    Follow Up Recommendations  SNF    Barriers to Discharge None concerned regarding husband's ability to care for his wife due to amount of physical assistance needed  Equipment Recommendations  None recommended by OT    Recommendations for Other Services  SW consult  Frequency  Min 2X/week    Precautions / Restrictions Precautions Precautions: Fall Precaution Comments: high fall risk Restrictions Weight Bearing Restrictions: No   Pertinent Vitals/Pain Did not rate. C/o pain RLE.    ADL  Grooming: Moderate assistance Where Assessed - Grooming: Supported sitting Upper Body Bathing: Maximal assistance Lower Body Bathing: Maximal assistance Where Assessed - Lower Body Bathing: Supported sit to stand Upper Body Dressing: Maximal assistance Where Assessed - Upper Body Dressing: Supported sitting Lower Body Dressing: Maximal assistance Where Assessed - Lower Body Dressing:  Supported sit to Pharmacist, hospital: Maximal assistance Toilet Transfer Method: Sit to stand;Other (comment) (ambulating +2 Max A) Toilet Transfer Equipment: Comfort height toilet Toileting - Clothing Manipulation and Hygiene: +1 Total assistance Where Assessed - Toileting Clothing Manipulation and Hygiene: Lean right and/or left;Sit on 3-in-1 or toilet Equipment Used: Rolling walker;Gait belt Transfers/Ambulation Related to ADLs: Max A +2 for safety ADL Comments: limited by poor endurance, poor posutral control and general weakness and cognitive deficits    OT Diagnosis: Generalized weakness;Cognitive deficits;Acute pain;Altered mental status  OT Problem List: Decreased strength;Decreased range of motion;Decreased activity tolerance;Impaired balance (sitting and/or standing);Decreased cognition;Decreased safety awareness;Decreased knowledge of use of DME or AE;Cardiopulmonary status limiting activity;Pain OT Treatment Interventions: Self-care/ADL training;Therapeutic exercise;DME and/or AE instruction;Therapeutic activities;Cognitive remediation/compensation;Patient/family education;Balance training   OT Goals Acute Rehab OT Goals OT Goal Formulation: With patient/family Time For Goal Achievement: 04/18/13 Potential to Achieve Goals: Good ADL Goals Pt Will Transfer to Toilet: with min assist;with caregiver independent in assisting;Ambulation;with DME;3-in-1 ADL Goal: Toilet Transfer - Progress: Goal set today Pt Will Perform Toileting - Clothing Manipulation: with mod assist;with caregiver independent in assisting;Sitting on 3-in-1 or toilet ADL Goal: Toileting - Clothing Manipulation - Progress: Goal set today Pt Will Perform Toileting - Hygiene: with mod assist;with caregiver independent in assisting;Sit to stand from 3-in-1/toilet ADL Goal: Toileting - Hygiene - Progress: Goal set today Additional ADL Goal #1: Participate infunctional activity in standing position wiht min a x 5 min  without rest break  ADL Goal: Additional Goal #1 - Progress: Goal set today  Visit Information  Last OT Received On: 04/04/13 Assistance Needed: +2 PT/OT Co-Evaluation/Treatment:  Yes    Subjective Data      Prior Functioning     Home Living Lives With: Spouse Available Help at Discharge: Available 24 hours/day Type of Home: House Home Access: Stairs to enter [employer] of Steps: 2 Entrance Stairs-Rails: Right Home Layout: One level Bathroom Shower/Tub: Health visitor: Standard Bathroom Accessibility: Yes Home Adaptive Equipment: Bedside commode/3-in-1;Shower chair with back;Walker - rolling;Walker - four wheeled;Wheelchair - Careers adviser (comment) (transfer chair) Prior Function Level of Independence: Needs assistance Needs Assistance: Bathing;Dressing;Grooming;Toileting;Meal Prep;Light Housekeeping;Gait;Transfers Bath: Maximal Dressing: Maximal Grooming: Moderate Toileting: Maximal Meal Prep: Total Light Housekeeping: Total Gait Assistance: min A. HHA. walked @ 40 ft Transfer Assistance: Min A Able to Take Stairs?: Yes Driving: No Comments: husband states that for a few days PTA, pt was having more difficulty with walking and had a fall in the bathroom Communication Communication: Other (comment) (alzheimers)         Vision/Perception     Cognition  Cognition Arousal/Alertness: Awake/alert Behavior During Therapy: WFL for tasks assessed/performed Overall Cognitive Status: History of cognitive impairments - at baseline    Extremity/Trunk Assessment Right Upper Extremity Assessment RUE ROM/Strength/Tone: Deficits (general weakness) Left Upper Extremity Assessment LUE ROM/Strength/Tone: Deficits (general weakness) Right Lower Extremity Assessment RLE ROM/Strength/Tone: Unable to fully assess;Due to impaired cognition;Deficits RLE ROM/Strength/Tone Deficits: Able to weightbear but appears painful enough to alter gait  pattern Left Lower Extremity Assessment LLE ROM/Strength/Tone: Unable to fully assess;Due to impaired cognition;Deficits LLE ROM/Strength/Tone Deficits: able to weightbear Trunk Assessment Trunk Assessment: Kyphotic     Mobility Bed Mobility Bed Mobility: Supine to Sit Supine to Sit: 1: +2 Total assist Supine to Sit: Patient Percentage: 30% Transfers Transfers: Sit to Stand;Stand to Sit Sit to Stand: 1: +2 Total assist Sit to Stand: Patient Percentage: 30% Details for Transfer Assistance: Assist to weightshift, rise, stabilize, and control descent. Multimodal cues for participation, initiation.      Exercise     Balance Balance Balance Assessed: Yes Static Standing Balance Static Standing - Balance Support: Bilateral upper extremity supported Static Standing - Level of Assistance: 1: +1 Total assist Dynamic Standing Balance Dynamic Standing - Balance Support: Bilateral upper extremity supported Dynamic Standing - Level of Assistance: 1: +1 Total assist Standardized Balance Assessment Standardized Balance Assessment:  (posterior lean)   End of Session OT - End of Session Equipment Utilized During Treatment: Gait belt Activity Tolerance: Patient limited by fatigue Patient left: in chair;with call bell/phone within reach;with family/visitor present Nurse Communication: Mobility status  GO     Louie Meaders,HILLARY 04/04/2013, 1:36 PM [facility], OTR/L  [phone] 04/04/2013

## 2013-04-04 NOTE — Progress Notes (Signed)
   CARE MANAGEMENT NOTE 04/04/2013  Patient:  Anne [name], Anne [name]   Account Number:  [id number]  Date Initiated:  04/04/2013  Documentation initiated by:  Ezekiel Ina  Subjective/Objective Assessment:   79yo Pt admitted with cco UTI, weakness,severe Alzheimer's dementia,     Action/Plan:   from home/plan to dc to SNF [facility]   Anticipated DC Date:  04/07/2013   Anticipated DC Plan:  SKILLED NURSING FACILITY  In-house referral  Clinical Social Worker      DC Planning Services  CM consult      Choice offered to / List presented to:             Status of service:  In process, will continue to follow Medicare Important Message given?   (If response is "NO", the following Medicare IM given date fields will be blank) Date Medicare IM given:   Date Additional Medicare IM given:    Discharge Disposition:    Per UR Regulation:  Reviewed for med. necessity/level of care/duration of stay  If discussed at Long Length of Stay Meetings, dates discussed:    Comments:  04/04/13 MMcGibboney, RN, BSN Chart Reviewed. [name] husband at bedside, states "PT just came in, their recommendation was SNF, I would like [facility]".   Referral given to CSW.

## 2013-04-04 NOTE — Progress Notes (Signed)
Clinical Social Work Department BRIEF PSYCHOSOCIAL ASSESSMENT 04/04/2013  Patient:  Anne [name], Anne [name]     Account Number:  [id number]     Admit date:  04/03/2013  Clinical Social Worker:  Hattie Perch  Date/Time:  04/04/2013 12:00 M  Referred by:  Physician  Date Referred:  04/04/2013 Referred for  SNF Placement   Other Referral:   Interview type:  Family Other interview type:    PSYCHOSOCIAL DATA Living Status:  FAMILY Admitted from facility:   Level of care:   Primary support name:  WB Dimauro Primary support relationship to patient:  SPOUSE Degree of support available:   good    CURRENT CONCERNS Current Concerns  Post-Acute Placement   Other Concerns:    SOCIAL WORK ASSESSMENT / PLAN CSW met with patient and husband at bedside. patient is not oriented and is unable to participate in assessment. spouse is agreeable to snf placement but is requesting only [facility].   Assessment/plan status:   Other assessment/ plan:   Information/referral to community resources:    PATIENT'S/FAMILY'S RESPONSE TO PLAN OF CARE: spouse is adamant that he would like patient at [facility] for snf.

## 2013-04-04 NOTE — Evaluation (Signed)
Physical Therapy Evaluation Patient Details Name: Anne [name] MRN: [id number] DOB: 1916/09/03 Today's Date: 04/04/2013 Time: 2130-8657 PT Time Calculation (min): 30 min  PT Assessment / Plan / Recommendation Clinical Impression  79 yo female admitted with UTI, FTT, weakness. Hx of dementia. On eval, pt required +2 assist for mobility-able to ambulate ~15-20 feet with RW but with significant assistance needed. Husband present and states he plans to take pt home. Unsure if husband really understands level of assistance pt is now requiring. Feel pt needs SNF for rehab (if husband will agree). If pt discharges home, she will likley require +2 assist for safe mobility.    PT Assessment  Patient needs continued PT services    Follow Up Recommendations  SNF;Supervision/Assistance - 24 hour. (If home, pt will need to consider having 2nd person assist for safety)    Does the patient have the potential to tolerate intense rehabilitation      Barriers to Discharge        Equipment Recommendations       Recommendations for Other Services OT consult   Frequency Min 3X/week    Precautions / Restrictions Precautions Precautions: Fall Precaution Comments: high fall risk Restrictions Weight Bearing Restrictions: No   Pertinent Vitals/Pain Pt limps when attempting to ambulate appears to be r leg but pt unable to verbalize this.       Mobility  Bed Mobility Bed Mobility: Supine to Sit Supine to Sit: 1: +2 Total assist Supine to Sit: Patient Percentage: 30% Transfers Transfers: Sit to Stand Sit to Stand: 1: +2 Total assist Sit to Stand: Patient Percentage: 30% Details for Transfer Assistance: Assist to weightshift, rise, stabilize, and control descent. Multimodal cues for participation, initiation.  Ambulation/Gait Ambulation/Gait Assistance: 1: +2 Total assist Ambulation/Gait: Patient Percentage: 40% Ambulation Distance (Feet): 15 Feet (x 2) Assistive device: Rolling walker;2  person hand held assist Ambulation/Gait Assistance Details: 2 HHA into bathroom-therapists having to support pt quite a bit and also noted antalgic gait pattern R LE. Some improvement with use of RW and gait less antalgic. Assist to support/stabilize pt and maneuver with RW Gait Pattern: Trunk flexed;Step-to pattern;Decreased stride length;Decreased step length - right;Decreased step length - left;Antalgic;Narrow base of support;Shuffle    Exercises     PT Diagnosis: Difficulty walking;Abnormality of gait;Generalized weakness  PT Problem List: Decreased strength;Decreased activity tolerance;Decreased balance;Decreased mobility;Pain;Decreased cognition PT Treatment Interventions: DME instruction;Gait training;Stair training;Functional mobility training;Therapeutic activities;Therapeutic exercise;Balance training;Patient/family education   PT Goals Acute Rehab PT Goals PT Goal Formulation: With family Time For Goal Achievement: 05/19/13 Potential to Achieve Goals: Fair Pt will go Supine/Side to Sit: with mod assist PT Goal: Supine/Side to Sit - Progress: Goal set today Pt will go Sit to Supine/Side: with mod assist PT Goal: Sit to Supine/Side - Progress: Goal set today Pt will go Sit to Stand: with mod assist PT Goal: Sit to Stand - Progress: Goal set today Pt will Transfer Bed to Chair/Chair to Bed: with mod assist PT Transfer Goal: Bed to Chair/Chair to Bed - Progress: Goal set today Pt will Ambulate: 16 - 50 feet;with mod assist;with least restrictive assistive device PT Goal: Ambulate - Progress: Goal set today Pt will Go Up / Down Stairs: 1-2 stairs;with mod assist;with rail(s) PT Goal: Up/Down Stairs - Progress: Goal set today  Visit Information  Last PT Received On: 04/04/13 Assistance Needed: +2 PT/OT Co-Evaluation/Treatment: Yes    Subjective Data  Subjective: I have a lot of people in here Patient Stated Goal: husband wants  to take pt home   Prior Functioning  Home  Living Lives With: Spouse Available Help at Discharge: Available 24 hours/day Type of Home: House Home Access: Stairs to enter [employer] of Steps: 2 Entrance Stairs-Rails: Right Home Layout: One level Bathroom Shower/Tub: Health visitor: Standard Bathroom Accessibility: Yes Home Adaptive Equipment: Bedside commode/3-in-1;Shower chair with back;Walker - rolling;Walker - four wheeled;Wheelchair - Careers adviser (comment) (transfer chair) Prior Function Level of Independence: Needs assistance Needs Assistance: Bathing;Dressing;Grooming;Toileting;Meal Prep;Light Housekeeping;Gait;Transfers Bath: Maximal Dressing: Maximal Grooming: Moderate Toileting: Maximal Meal Prep: Total Light Housekeeping: Total Gait Assistance: min A. HHA. walked @ 40 ft Transfer Assistance: Min A Able to Take Stairs?: Yes Driving: No Comments: husband states that for a few days PTA, pt was having more difficulty with walking and had a fall in the bathroom Communication Communication: Other (comment) (alzheimers)    Cognition  Cognition Arousal/Alertness: Awake/alert Behavior During Therapy: WFL for tasks assessed/performed Overall Cognitive Status: History of cognitive impairments - at baseline    Extremity/Trunk Assessment Right Upper Extremity Assessment RUE ROM/Strength/Tone: Deficits (general weakness) Left Upper Extremity Assessment LUE ROM/Strength/Tone: Deficits (general weakness) Right Lower Extremity Assessment RLE ROM/Strength/Tone: Unable to fully assess;Due to impaired cognition;Deficits RLE ROM/Strength/Tone Deficits: Able to weightbear but appears painful enough to alter gait pattern Left Lower Extremity Assessment LLE ROM/Strength/Tone: Unable to fully assess;Due to impaired cognition;Deficits LLE ROM/Strength/Tone Deficits: able to weightbear Trunk Assessment Trunk Assessment: Kyphotic   Balance Balance Balance Assessed: Yes Static Standing Balance Static  Standing - Balance Support: Bilateral upper extremity supported Static Standing - Level of Assistance: 1: +1 Total assist Dynamic Standing Balance Dynamic Standing - Balance Support: Bilateral upper extremity supported Dynamic Standing - Level of Assistance: 1: +1 Total assist  End of Session PT - End of Session Equipment Utilized During Treatment: Gait belt Activity Tolerance: Patient limited by pain Patient left: in chair;with call bell/phone within reach;with family/visitor present  GP     Rebeca Alert, MPT Pager: [phone]

## 2013-04-04 NOTE — Progress Notes (Addendum)
Physical Therapy Treatment Patient Details Name: Anne [name] MRN: [id number] DOB: June 13, 1916 Today's Date: 04/04/2013 Time: 5621-3086 PT Time Calculation (min): 26 min  PT Assessment / Plan / Recommendation Comments on Treatment Session  2nd session to having husband practice mobilizing with pt. Very unsafe techniques and pt is a very high fall risk. Do not feel it is safest option for pt to d/c home. Discussed this with pt and recommend SNF placement for pt and husband's safety. Husband in agreement after discussion. Attempted to call MD about possible need for xray R hip/pelvis but office states MD is off today and they do not have a pager for him. Left sticky note in chart. Also contacted SW about need for SNF.     Follow Up Recommendations  SNF;Supervision/Assistance - 24 hour     Does the patient have the potential to tolerate intense rehabilitation     Barriers to Discharge        Equipment Recommendations       Recommendations for Other Services OT consult  Frequency Min 3X/week   Plan Discharge plan remains appropriate    Precautions / Restrictions Precautions Precautions: Fall Precaution Comments: high fall risk Restrictions Weight Bearing Restrictions: No   Pertinent Vitals/Pain R LE with ambulation    Mobility  Bed Mobility Bed Mobility: Sit to Supine Sit to Supine: 1: +2 Total assist Sit to Supine: Patient Percentage: 30% Details for Bed Mobility Assistance: Assist for bil LEs onto bed and trunk to supine.  Transfers Transfers: Sit to Stand;Stand to Sit Sit to Stand: 2: Max assist;From chair/3-in-1 Sit to Stand: Patient Percentage: 30% Stand to Sit: 2: Max assist;To bed Details for Transfer Assistance: Husband pulled pt up by both arms. Max encouragment and assistance required. Pt unsteady. Therapists close by for safety and to assist if needed.  Ambulation/Gait Ambulation/Gait Assistance: 2: Max assist Ambulation Distance (Feet): 40 Feet Assistive  device: Rolling walker;1 person hand held assist Ambulation/Gait Assistance Details: Began ambulation with RW. Husband assisting pt-therapists close by for safety. On way back to room, had husband simulate technique that he normally uses with pt for ambulation (without RW), increased assistance needed (at times +2 assist). Pt having trouble weightbearing on R LE.  Gait Pattern: Trunk flexed;Step-to pattern;Decreased stride length;Decreased step length - right;Decreased step length - left;Antalgic;Narrow base of support;Shuffle    Exercises     PT Diagnosis: Difficulty walking;Abnormality of gait;Generalized weakness  PT Problem List: Decreased strength;Decreased activity tolerance;Decreased balance;Decreased mobility;Pain;Decreased cognition PT Treatment Interventions: DME instruction;Gait training;Stair training;Functional mobility training;Therapeutic activities;Therapeutic exercise;Balance training;Patient/family education   PT Goals Acute Rehab PT Goals PT Goal Formulation: With family Time For Goal Achievement: 05/19/13 Potential to Achieve Goals: Fair Pt will go Supine/Side to Sit: with mod assist PT Goal: Supine/Side to Sit - Progress: Goal set today Pt will go Sit to Supine/Side: with mod assist PT Goal: Sit to Supine/Side - Progress: Progressing toward goal Pt will go Sit to Stand: with mod assist PT Goal: Sit to Stand - Progress: Progressing toward goal Pt will Transfer Bed to Chair/Chair to Bed: with mod assist PT Transfer Goal: Bed to Chair/Chair to Bed - Progress: Goal set today Pt will Ambulate: 16 - 50 feet;with mod assist;with rolling walker PT Goal: Ambulate - Progress: Progressing toward goal Pt will Go Up / Down Stairs: 1-2 stairs;with mod assist;with rail(s) PT Goal: Up/Down Stairs - Progress: Goal set today  Visit Information  Last PT Received On: 04/04/13 Assistance Needed: +2 (safety) PT/OT Co-Evaluation/Treatment: Yes  Subjective Data  Subjective: I don't  wanna lie down.  Patient Stated Goal: husband wants to take pt home   Cognition  Cognition Arousal/Alertness: Awake/alert Behavior During Therapy: WFL for tasks assessed/performed Overall Cognitive Status: History of cognitive impairments - at baseline Area of Impairment: Orientation;Attention;Memory;Following commands;Safety/judgement;Awareness;Problem solving Orientation Level: Disoriented to;Place;Time;Situation Current Attention Level: Focused Memory: Decreased recall of precautions Following Commands: Follows one step commands inconsistently Safety/Judgement: Decreased awareness of safety;Decreased awareness of deficits Awareness: Intellectual Problem Solving: Slow processing;Decreased initiation;Difficulty sequencing;Requires verbal cues;Requires tactile cues    Balance  Balance Balance Assessed: Yes Static Standing Balance Static Standing - Balance Support: Bilateral upper extremity supported Static Standing - Level of Assistance: 1: +1 Total assist Dynamic Standing Balance Dynamic Standing - Balance Support: Bilateral upper extremity supported Dynamic Standing - Level of Assistance: 1: +1 Total assist  End of Session PT - End of Session Equipment Utilized During Treatment: Gait belt Activity Tolerance: Patient limited by pain Patient left: in bed;with bed alarm set;with family/visitor present;with call bell/phone within reach   GP     Rebeca Alert, MPT Pager: [phone]

## 2013-04-04 NOTE — Progress Notes (Signed)
Subjective: No complaints- husband states that he wants to take her home when she is ready.  Does not desire SNF at this point but if he gets her home and can't handle things he would reconsider.    Objective: Vital signs in last 24 hours: Temp:  [98 F (36.7 C)-100.2 F (37.9 C)] 98.2 F (36.8 C) (05/28 0500) Pulse Rate:  [71-97] 71 (05/28 0500) Resp:  [18-20] 18 (05/28 0500) BP: (117-156)/(54-83) 142/54 mmHg (05/28 0500) SpO2:  [93 %-98 %] 97 % (05/28 0500) Weight:  [43.9 kg (96 lb 12.5 oz)] 43.9 kg (96 lb 12.5 oz) (05/27 1446) Weight change:     CBG (last 3)   Recent Labs  04/03/13 2114 04/04/13 0718  GLUCAP 203* 145*    Intake/Output from previous day: 05/27 0701 - 05/28 0700 In: 935 [P.O.:200; I.V.:735] Out: 2 [Urine:2] Intake/Output this shift:    General appearance: alert and cooperative; disoriented;  knows husbands name Eyes: no scleral icterus Throat: oropharynx moist without erythema Resp: clear to auscultation bilaterally Cardio: regular rate and rhythm and grade 3/6 SEM LUSB GI: soft, non-tender; bowel sounds normal; no masses,  no organomegaly Extremities: no clubbing, cyanosis or edema   Lab Results:  Recent Labs  04/03/13 0940  NA 133*  K 3.7  CL 95*  CO2 26  GLUCOSE 179*  BUN 19  CREATININE 0.90  CALCIUM 9.9    Recent Labs  04/03/13 0940  AST 21  ALT 13  ALKPHOS 98  BILITOT 0.4  PROT 7.6  ALBUMIN 3.6    Recent Labs  04/03/13 0940  WBC 8.0  HGB 12.3  HCT 37.3  MCV 81.6  PLT 204   No results found for this basename: INR, PROTIME    Recent Labs  04/03/13 0940  TROPONINI <0.30    Recent Labs  04/03/13 2027  TSH 2.289   A1C 6.7  Studies/Results: Dg Chest 2 View  04/03/2013   *RADIOLOGY REPORT*  Clinical Data: Pain, confused.  CHEST - 2 VIEW  Comparison: 01/09/2011  Findings: Heart is borderline in size.  Tortuosity of the thoracic aorta.  Bibasilar densities likely reflect scarring.  No acute opacities.  No  effusions.  No acute bony abnormality.  IMPRESSION: No acute cardiopulmonary disease.   Original Report Authenticated By: Charlett Nose, M.D.   Ct Head Wo Contrast  04/03/2013   *RADIOLOGY REPORT*  Clinical Data: Fall, weakness.  Dimension.  CT HEAD WITHOUT CONTRAST  Technique:  Contiguous axial images were obtained from the base of the skull through the vertex without contrast.  Comparison: 02/25/2010  Findings: Atrophy, chronic microvascular changes.  Old lacunar infarct in the left basal ganglia. No acute intracranial abnormality.  Specifically, no hemorrhage, hydrocephalus, mass lesion, acute infarction, or significant intracranial injury.  No acute calvarial abnormality. Visualized paranasal sinuses and mastoids clear.  Orbital soft tissues unremarkable.  IMPRESSION: No acute intracranial abnormality.  Atrophy, chronic microvascular disease.  Old left basal ganglia lacunar infarct.   Original Report Authenticated By: Charlett Nose, M.D.     Medications: Scheduled: . cefTRIAXone (ROCEPHIN)  IV  1 g Intravenous Q24H  . enoxaparin (LOVENOX) injection  30 mg Subcutaneous Q24H  . insulin aspart  0-5 Units Subcutaneous QHS  . insulin aspart  0-9 Units Subcutaneous TID WC  . levothyroxine  75 mcg Oral QAC breakfast  . metFORMIN  500 mg Oral BID WC   Continuous: . sodium chloride 75 mL/hr at 04/04/13 0600    Assessment/Plan: 1. Failure to thrive/generalized weakness-this is  likely secondary to urinary tract infection. Discontinuation of Synthroid could play a role. Continue IV Rocephin pending urine and blood culture data. Gentle hydration. Physical therapy/occupational therapy evaluation.  2. Severe Alzheimer's dementia-her husband has not been interested in treatment in the past. Continue supportive therapy. Consider restarting hospice care  3. diabetes mellitus type 2- continue metformin and cover with sliding scale insulin.  4. Hypothyroidism- continue synthroid.  TSH normal.  5. Disposition-her  husband would like to continue to care for her at home. It is not clear that he will be able to do this at this point. Physical therapy/occupational therapy evaluation. Consider short-term skilled nursing facility for rehabilitation and consideration of long-term care. Anticipate discharge tomorrow to home with home health and in-home aides.  CM/SW consults to assist husband with discharge planning and interventions if he gets home and is unable to manage her to prevent readmission. 6. Burnadette Peter is DO NOT RESUSCITATE    LOS: 1 day   Jahna Liebert,W DOUGLAS 04/04/2013, 9:18 AM

## 2013-04-04 NOTE — Progress Notes (Signed)
Occupational Therapy Treatment Patient Details Name: Anne [name] MRN: [id number] DOB: 1916-05-26 Today's Date: 04/04/2013 Time: 4098-1191 OT Time Calculation (min): 26 min  OT Assessment / Plan / Recommendation Comments on Treatment Session Admitted s/p fall. Dementia. FTT. Discussed current level of care required for  pt with husband after husband attempted to ambulate with his wife. Pt/husband are at extemely high fall risk and required physical intervention from therapists to prevent falls AFter discussion, husband is in agreement to SNF for rehab prior to return home. Husband would like [facility]. Will follow acutley to max ADL and mobility and prevent further deconditioning. Pt with 2/4 dyspnea during ambulation. O2 SATS WNL. Frequent coughing noted.  Follow Up Recommendations  SNF    Barriers to Discharge  None concerned regarding husband's ability to care for his wife due to amount of physical assistance needed  Equipment Recommendations  None recommended by OT    Recommendations for Other Services  SW consult for SNF  Frequency Min 2X/week   Plan Discharge plan remains appropriate    Precautions / Restrictions Precautions Precautions: Fall Precaution Comments: high fall risk Restrictions Weight Bearing Restrictions: No   Pertinent Vitals/Pain Pt favors r LE.    ADL  Grooming: Moderate assistance Where Assessed - Grooming: Supported sitting Upper Body Bathing: Maximal assistance Lower Body Bathing: Maximal assistance Where Assessed - Lower Body Bathing: Supported sit to stand Upper Body Dressing: Maximal assistance Where Assessed - Upper Body Dressing: Supported sitting Lower Body Dressing: Maximal assistance Where Assessed - Lower Body Dressing: Supported sit to Pharmacist, hospital: Maximal assistance Toilet Transfer Method: Sit to stand;Other (comment) (ambulating +2 Max A) Toilet Transfer Equipment: Comfort height toilet Toileting - Clothing Manipulation  and Hygiene: +1 Total assistance Where Assessed - Toileting Clothing Manipulation and Hygiene: Lean right and/or left;Sit on 3-in-1 or toilet Equipment Used: Rolling walker;Gait belt Transfers/Ambulation Related to ADLs: Max A +2 for safety ADL Comments: Focus of session on working with husband to increase insight into level of care required at this time to care for his wife. Pt ambulated unsafely with wife @ 25 ft, requiring intervention from therapists to prevent fall.    OT Diagnosis: Generalized weakness;Cognitive deficits;Acute pain;Altered mental status  OT Problem List: Decreased strength;Decreased range of motion;Decreased activity tolerance;Impaired balance (sitting and/or standing);Decreased cognition;Decreased safety awareness;Decreased knowledge of use of DME or AE;Cardiopulmonary status limiting activity;Pain OT Treatment Interventions: Self-care/ADL training;Therapeutic exercise;DME and/or AE instruction;Therapeutic activities;Cognitive remediation/compensation;Patient/family education;Balance training   OT Goals Acute Rehab OT Goals OT Goal Formulation: With patient/family Time For Goal Achievement: 04/18/13 Potential to Achieve Goals: Good ADL Goals Pt Will Transfer to Toilet: with min assist;with caregiver independent in assisting;Ambulation;with DME;3-in-1 ADL Goal: Toilet Transfer - Progress: Progressing toward goals Pt Will Perform Toileting - Clothing Manipulation: with mod assist;with caregiver independent in assisting;Sitting on 3-in-1 or toilet ADL Goal: Toileting - Clothing Manipulation - Progress: Progressing toward goals Pt Will Perform Toileting - Hygiene: with mod assist;with caregiver independent in assisting;Sit to stand from 3-in-1/toilet ADL Goal: Toileting - Hygiene - Progress: Progressing toward goals Additional ADL Goal #1: Participate infunctional activity in standing position wiht min a x 5 min without rest break  ADL Goal: Additional Goal #1 - Progress:  Progressing toward goals  Visit Information  Last OT Received On: 04/04/13 Assistance Needed: +1 PT/OT Co-Evaluation/Treatment: Yes    Subjective Data      Prior Functioning  Home Living Lives With: Spouse Available Help at Discharge: Available 24 hours/day Type of Home: House Home  Access: Stairs to enter [employer] of Steps: 2 Entrance Stairs-Rails: Right Home Layout: One level Bathroom Shower/Tub: Health visitor: Standard Bathroom Accessibility: Yes Home Adaptive Equipment: Bedside commode/3-in-1;Shower chair with back;Walker - rolling;Walker - four wheeled;Wheelchair - Careers adviser (comment) (transfer chair) Prior Function Level of Independence: Needs assistance Needs Assistance: Bathing;Dressing;Grooming;Toileting;Meal Prep;Light Housekeeping;Gait;Transfers Bath: Maximal Dressing: Maximal Grooming: Moderate Toileting: Maximal Meal Prep: Total Light Housekeeping: Total Gait Assistance: min A. HHA. walked @ 40 ft Transfer Assistance: Min A Able to Take Stairs?: Yes Driving: No Comments: husband states that for a few days PTA, pt was having more difficulty with walking and had a fall in the bathroom Communication Communication: Other (comment) (alzheimers)    Cognition  Cognition Arousal/Alertness: Awake/alert Behavior During Therapy: WFL for tasks assessed/performed Overall Cognitive Status: History of cognitive impairments - at baseline Area of Impairment: Orientation;Attention;Memory;Following commands;Safety/judgement;Awareness;Problem solving Orientation Level: Disoriented to;Place;Time;Situation Current Attention Level: Focused Memory: Decreased recall of precautions Following Commands: Follows one step commands inconsistently Safety/Judgement: Decreased awareness of safety;Decreased awareness of deficits Awareness: Intellectual Problem Solving: Slow processing;Decreased initiation;Difficulty sequencing;Requires verbal  cues;Requires tactile cues    Mobility  Bed Mobility Bed Mobility: Supine to Sit Supine to Sit: 1: +2 Total assist Supine to Sit: Patient Percentage: 30% Transfers Transfers: Sit to Stand;Stand to Sit Sit to Stand: Other (comment) (with husband pulling up on both of pt's hands) Sit to Stand: Patient Percentage: 30% Details for Transfer Assistance: unsafe transfer technique. high fall risk    Exercises      Balance Balance Balance Assessed: Yes Static Standing Balance Static Standing - Balance Support: Bilateral upper extremity supported Static Standing - Level of Assistance: 1: +1 Total assist Dynamic Standing Balance Dynamic Standing - Balance Support: Bilateral upper extremity supported Dynamic Standing - Level of Assistance: 1: +1 Total assist Standardized Balance Assessment Standardized Balance Assessment:  (posterior lean)   End of Session OT - End of Session Equipment Utilized During Treatment: Gait belt Activity Tolerance: Patient limited by fatigue Patient left: in bed;with call bell/phone within reach;with bed alarm set;with family/visitor present Nurse Communication: Mobility status;Other (comment) (need for SNF)  GO     Dezyre Hoefer,HILLARY 04/04/2013, 1:48 PM [facility], OTR/L  [phone] 04/04/2013

## 2013-04-04 NOTE — Progress Notes (Addendum)
CSW met with patient and spouse at bedside. Agreeable to snf placement and requesting [facility].  Rhiann Boucher C. Jayne Peckenpaugh MSW, LCSW [phone]

## 2013-04-05 ENCOUNTER — Inpatient Hospital Stay (HOSPITAL_COMMUNITY): Payer: Medicare Other

## 2013-04-05 MED ORDER — ENSURE COMPLETE PO LIQD: 237.0000 mL | Freq: Two times a day (BID) | ORAL | Status: DC

## 2013-04-05 MED ORDER — DM-GUAIFENESIN ER 30-600 MG PO TB12
1.0000 | ORAL_TABLET | Freq: Two times a day (BID) | ORAL | Status: DC
  Administered 2013-04-05: 1 via ORAL
  Filled 2013-04-05 (×4): qty 1

## 2013-04-05 NOTE — Progress Notes (Signed)
INITIAL NUTRITION ASSESSMENT  DOCUMENTATION CODES Per approved criteria  -Underweight   INTERVENTION: - Ensure Complete BID - Will continue to monitor   NUTRITION DIAGNOSIS: Inadequate oral intake related to poor appetite/dementia as evidenced by <25% meal intake.   Goal: Pt to consume >75% of meals/supplements.   Monitor:  Weights, labs, intake  Reason for Assessment: Underweight  79 y.o. female  Admitting Dx: Urinary tract infection  ASSESSMENT: Pt with history of severe Alzheimer's dementia. Husband present at the bedside. He reports pt has been losing weight over the past 3-4 years, 30 pounds unintentionally. He reports pt typically eats 2-3 meals and 1 Ensure/day. He reports pt does not have any problems chewing/swallowing. Intake has been < 25% meals since admission. Noted plans for possible discharge tomorrow.   Height: Ht Readings from Last 1 Encounters:  04/03/13 5\' 3"  (1.6 m)    Weight: Wt Readings from Last 1 Encounters:  04/03/13 96 lb 12.5 oz (43.9 kg)    Ideal Body Weight: 115 lb  % Ideal Body Weight: 83  Wt Readings from Last 10 Encounters:  04/03/13 96 lb 12.5 oz (43.9 kg)    Usual Body Weight: 126 lb per husband's report  % Usual Body Weight: 76  BMI:  Body mass index is 17.15 kg/(m^2).  Estimated Nutritional Needs: Kcal: 1350-1550 Protein: 55-65g Fluid: 1.3-1.5L/day  Skin: Intact  Diet Order: General  EDUCATION NEEDS: -No education needs identified at this time   Intake/Output Summary (Last 24 hours) at 04/05/13 1626 Last data filed at 04/05/13 1200  Gross per 24 hour  Intake   1260 ml  Output      0 ml  Net   1260 ml    Last BM: 5/28  Labs:   Recent Labs Lab 04/03/13 0940  NA 133*  K 3.7  CL 95*  CO2 26  BUN 19  CREATININE 0.90  CALCIUM 9.9  GLUCOSE 179*    CBG (last 3)   Recent Labs  04/04/13 2157 04/05/13 0748 04/05/13 1154  GLUCAP 135* 154* 182*    Scheduled Meds: . cefTRIAXone (ROCEPHIN)  IV   1 g Intravenous Q24H  . dextromethorphan-guaiFENesin  1 tablet Oral BID  . enoxaparin (LOVENOX) injection  30 mg Subcutaneous Q24H  . insulin aspart  0-5 Units Subcutaneous QHS  . insulin aspart  0-9 Units Subcutaneous TID WC  . levothyroxine  75 mcg Oral QAC breakfast  . metFORMIN  500 mg Oral BID WC    Continuous Infusions: . sodium chloride 75 mL/hr (04/05/13 0339)    Past Medical History  Diagnosis Date  . Diabetes mellitus   . Dementia   . Thyroid disease   . Hypertension     Past Surgical History  Procedure Laterality Date  . Abdominal hysterectomy  1946    partial     Levon Hedger MS, RD, [place] [phone] Pager [phone] After Hours Pager

## 2013-04-05 NOTE — Progress Notes (Addendum)
Pt sat up in chair for about 3 hours, tolerated well.PT in to work with pt. Pt is confused and does not respond appropriately when asked to do do something. Not eating dinner, CBG-95, insulin and metformin held. Family at bedside.

## 2013-04-05 NOTE — Progress Notes (Signed)
CSW followed up with pt husband at bedside re: SNF placement.  CSW notified pt husband that [facility] is able to offer pt a bed at discharge.  Pt husband is agreeable to [facility].   Per MD, pt likely ready for discharge tomorrow and [facility] can accept pt tomorrow, Friday 5/30.  CSW to facilitate pt discharge needs when pt medically ready for discharge.  Jacklynn Lewis, MSW, LCSWA  Clinical Social Work [phone]

## 2013-04-05 NOTE — Plan of Care (Signed)
Problem: Phase I Progression Outcomes Goal: Voiding-avoid urinary catheter unless indicated Outcome: Progressing Pt is incontinent     

## 2013-04-05 NOTE — Progress Notes (Signed)
Subjective: Sitter at bedside reports that she slept well.  RN notes increased cough.  Family agreeable to SNF placement.  Objective: Vital signs in last 24 hours: Temp:  [97.1 F (36.2 C)-98.9 F (37.2 C)] 98.9 F (37.2 C) (05/29 0605) Pulse Rate:  [78-93] 92 (05/29 0605) Resp:  [19-20] 20 (05/29 0605) BP: (127-146)/(54-113) 127/54 mmHg (05/29 0605) SpO2:  [91 %-98 %] 91 % (05/29 0605) Weight change:  Last BM Date: 04/04/13  CBG (last 3)   Recent Labs  04/04/13 1118 04/04/13 1740 04/04/13 2157  GLUCAP 143* 125* 135*    Intake/Output from previous day: 05/28 0701 - 05/29 0700 In: 840 [P.O.:90; I.V.:750] Out: -  Intake/Output this shift: Total I/O In: 750 [I.V.:750] Out: -   General appearance: alert and no distress Eyes: no scleral icterus Throat: oropharynx moist without erythema Resp: clear to auscultation bilaterally and intermittent cough Cardio: regular rate and rhythm and grade 3/6 SEM GI: soft, non-tender; bowel sounds normal; no masses,  no organomegaly Extremities: no clubbing, cyanosis or edema   Lab Results:  Recent Labs  04/03/13 0940  NA 133*  K 3.7  CL 95*  CO2 26  GLUCOSE 179*  BUN 19  CREATININE 0.90  CALCIUM 9.9    Recent Labs  04/03/13 0940  AST 21  ALT 13  ALKPHOS 98  BILITOT 0.4  PROT 7.6  ALBUMIN 3.6    Recent Labs  04/03/13 0940  WBC 8.0  HGB 12.3  HCT 37.3  MCV 81.6  PLT 204   No results found for this basename: INR, PROTIME    Recent Labs  04/03/13 0940  TROPONINI <0.30    Recent Labs  04/03/13 2027  TSH 2.289   Urine culture- >100K Klebsiella sensitive to Rocephin and Cipro  Studies/Results: Dg Chest 2 View  04/03/2013   *RADIOLOGY REPORT*  Clinical Data: Pain, confused.  CHEST - 2 VIEW  Comparison: 01/09/2011  Findings: Heart is borderline in size.  Tortuosity of the thoracic aorta.  Bibasilar densities likely reflect scarring.  No acute opacities.  No effusions.  No acute bony abnormality.   IMPRESSION: No acute cardiopulmonary disease.   Original Report Authenticated By: Charlett Nose, M.D.   Ct Head Wo Contrast  04/03/2013   *RADIOLOGY REPORT*  Clinical Data: Fall, weakness.  Dimension.  CT HEAD WITHOUT CONTRAST  Technique:  Contiguous axial images were obtained from the base of the skull through the vertex without contrast.  Comparison: 02/25/2010  Findings: Atrophy, chronic microvascular changes.  Old lacunar infarct in the left basal ganglia. No acute intracranial abnormality.  Specifically, no hemorrhage, hydrocephalus, mass lesion, acute infarction, or significant intracranial injury.  No acute calvarial abnormality. Visualized paranasal sinuses and mastoids clear.  Orbital soft tissues unremarkable.  IMPRESSION: No acute intracranial abnormality.  Atrophy, chronic microvascular disease.  Old left basal ganglia lacunar infarct.   Original Report Authenticated By: Charlett Nose, M.D.     Medications: Scheduled: . cefTRIAXone (ROCEPHIN)  IV  1 g Intravenous Q24H  . enoxaparin (LOVENOX) injection  30 mg Subcutaneous Q24H  . insulin aspart  0-5 Units Subcutaneous QHS  . insulin aspart  0-9 Units Subcutaneous TID WC  . levothyroxine  75 mcg Oral QAC breakfast  . metFORMIN  500 mg Oral BID WC   Continuous: . sodium chloride 75 mL/hr (04/05/13 0339)    Assessment/Plan: 1. Failure to thrive/generalized weakness-this is likely secondary to urinary tract infection.  Continue IV Rocephin and transition to Cipro if CXR is clear prior to  discharge. Continue gentle hydration. Continue Physical therapy/occupational therapy.  2. Cough- obtain CXR.  Mucinex DM. 3. Severe Alzheimer's dementia-her husband has not been interested in treatment in the past. Continue supportive therapy. Consider restarting hospice care.  4. Diabetes mellitus type 2- continue metformin and cover with sliding scale insulin.  5. Hypothyroidism- continue synthroid. TSH normal.  6. Disposition-husband agreeable to SNF  placement.  Will plan on discharge tomorrow if stable. 7. Anne [name] is DO NOT RESUSCITATE    LOS: 2 days   Anne [name],W DOUGLAS 04/05/2013, 6:55 AM

## 2013-04-05 NOTE — Plan of Care (Signed)
Problem: Phase I Progression Outcomes Goal: Initial discharge plan identified Outcome: Progressing Possible discharge 5/30 to [facility]

## 2013-04-05 NOTE — Progress Notes (Addendum)
Clinical Social Work Department CLINICAL SOCIAL WORK PLACEMENT NOTE 04/05/2013  Patient:  TAIZ, BICKLE  Account Number:  [id number] Admit date:  04/03/2013  Clinical Social Worker:  Becky Sax, LCSW  Date/time:  04/04/2013 12:00 M  Clinical Social Work is seeking post-discharge placement for this patient at the following level of care:   SKILLED NURSING   (*CSW will update this form in Epic as items are completed)   04/04/2013  Patient/family provided with [facility] Department of Clinical Social Work's list of facilities offering this level of care within the geographic area requested by the patient (or if unable, by the patient's family).  04/04/2013  Patient/family informed of their freedom to choose among providers that offer the needed level of care, that participate in Medicare, Medicaid or managed care program needed by the patient, have an available bed and are willing to accept the patient.  04/04/2013  Patient/family informed of [facility]' ownership interest in [facility], as well as of the fact that they are under no obligation to receive care at this facility.  PASARR submitted to EDS on 04/04/2013 PASARR number received from EDS on 04/04/2013  FL2 transmitted to all facilities in geographic area requested by pt/family on  04/04/2013 FL2 transmitted to all facilities within larger geographic area on   Patient informed that his/her managed care company has contracts with or will negotiate with  certain facilities, including the following:     Patient/family informed of bed offers received:  04/05/2013 Patient chooses bed at [facility] Physician recommends and patient chooses bed at    Patient to be transferred to [facility] on  03/27/2013 Patient to be transferred to facility by ambulance Sharin Mons)  The following physician request were entered in Epic:   Additional Comments:    Jacklynn Lewis, MSW, LCSWA  Clinical Social Work [phone]

## 2013-04-05 NOTE — Progress Notes (Signed)
Physical Therapy Treatment Patient Details Name: Anne [name] MRN: [id number] DOB: 15-Jul-1916 Today's Date: 04/05/2013 Time: 0940-1006 PT Time Calculation (min): 26 min  PT Assessment / Plan / Recommendation Comments on Treatment Session  Pt still has antalgic gait pattern-R LE appears to be painful leg. Pt unable to verbalize. Notified RN of this as well today (sticky note left in chart for MD on yesterday). Continue to recommend SNF.     Follow Up Recommendations  SNF;Supervision/Assistance - 24 hour     Does the patient have the potential to tolerate intense rehabilitation     Barriers to Discharge        Equipment Recommendations  Rolling walker with 5" wheels    Recommendations for Other Services OT consult  Frequency Min 3X/week   Plan Discharge plan remains appropriate    Precautions / Restrictions Precautions Precautions: Fall Precaution Comments: high fall risk Restrictions Weight Bearing Restrictions: No   Pertinent Vitals/Pain R LE appears painful-pt unable to verbalize this    Mobility  Bed Mobility Bed Mobility: Supine to Sit Supine to Sit: 2: Max assist Details for Bed Mobility Assistance: Assist for bil LEs off bed and trunk to upright. Utilized bedpad to assist with scooting, positioning. Pt needs help initiating.  Transfers Transfers: Sit to Stand;Stand to Sit Sit to Stand: 2: Max assist;From bed;From toilet Stand to Sit: 3: Mod assist;To chair/3-in-1;To toilet Details for Transfer Assistance: Assist to rise, stabilize, control descent, and for hand placement. Multimodal cues for safety, technique.  Ambulation/Gait Ambulation/Gait Assistance: 2: Max assist Ambulation Distance (Feet): 15 Feet (x 2) Assistive device: Rolling walker Ambulation/Gait Assistance Details: Used RW this session (pt does not like to). Assist to stabilize pt throughout ambulation and to maneuver with RW. Increased time. Gait still antalgic-RN notified of this.  Gait Pattern:  Trunk flexed;Decreased stride length;Decreased step length - left;Decreased step length - right;Narrow base of support;Step-to pattern    Exercises     PT Diagnosis:    PT Problem List:   PT Treatment Interventions:     PT Goals Acute Rehab PT Goals Pt will go Supine/Side to Sit: with mod assist PT Goal: Supine/Side to Sit - Progress: Progressing toward goal Pt will go Sit to Stand: with mod assist PT Goal: Sit to Stand - Progress: Progressing toward goal Pt will Ambulate: with mod assist;16 - 50 feet;with rolling walker PT Goal: Ambulate - Progress: Progressing toward goal  Visit Information  Last PT Received On: 04/05/13 Assistance Needed: +2 (safety)    Subjective Data  Subjective: My back hurts.  Patient Stated Goal: Plan is for rehab.    Cognition  Cognition Arousal/Alertness: Awake/alert Behavior During Therapy: WFL for tasks assessed/performed Overall Cognitive Status: History of cognitive impairments - at baseline    Balance     End of Session PT - End of Session Equipment Utilized During Treatment: Gait belt Activity Tolerance: Patient limited by pain;Patient limited by fatigue Patient left: in chair;with call bell/phone within reach;with family/visitor present Nurse Communication: Mobility status;Precautions (+2 for safety)   GP     Rebeca Alert, MPT Pager: [phone]

## 2013-04-06 ENCOUNTER — Other Ambulatory Visit (HOSPITAL_COMMUNITY): Payer: Medicare Other

## 2013-04-06 ENCOUNTER — Inpatient Hospital Stay (HOSPITAL_COMMUNITY): Payer: Medicare Other

## 2013-04-06 ENCOUNTER — Other Ambulatory Visit: Payer: Self-pay | Admitting: Geriatric Medicine

## 2013-04-06 DIAGNOSIS — R05 Cough: Secondary | ICD-10-CM | POA: Diagnosis present

## 2013-04-06 DIAGNOSIS — M79604 Pain in right leg: Secondary | ICD-10-CM | POA: Diagnosis present

## 2013-04-06 MED ORDER — LORAZEPAM 0.5 MG PO TABS: 0.5000 mg | ORAL_TABLET | Freq: Three times a day (TID) | ORAL | Status: DC | PRN

## 2013-04-06 MED ORDER — CIPROFLOXACIN HCL 500 MG PO TABS: 500.0000 mg | ORAL_TABLET | Freq: Two times a day (BID) | ORAL | Status: DC

## 2013-04-06 MED ORDER — ENSURE COMPLETE PO LIQD: 237.0000 mL | Freq: Two times a day (BID) | ORAL | Status: DC

## 2013-04-06 MED ORDER — LORAZEPAM 0.5 MG PO TABS: 0.5000 mg | ORAL_TABLET | Freq: Two times a day (BID) | ORAL | Status: DC

## 2013-04-06 MED ORDER — CIPROFLOXACIN HCL 500 MG PO TABS
500.0000 mg | ORAL_TABLET | Freq: Two times a day (BID) | ORAL | Status: DC
  Administered 2013-04-06: 500 mg via ORAL
  Filled 2013-04-06 (×3): qty 1

## 2013-04-06 MED ORDER — ACETAMINOPHEN 325 MG PO TABS: 650.0000 mg | ORAL_TABLET | Freq: Four times a day (QID) | ORAL | Status: DC | PRN

## 2013-04-06 MED ORDER — DM-GUAIFENESIN ER 30-600 MG PO TB12: 1.0000 | ORAL_TABLET | Freq: Two times a day (BID) | ORAL | Status: DC

## 2013-04-06 NOTE — Progress Notes (Signed)
Pt for discharge to [facility].  CSW facilitated pt discharge needs including contacting facility, faxing pt discharge information via [facility], providing RN phone number to call report, discussing with pt and pt spouse at bedside and requesting ambulance transport for pt to [facility] (Service Request ID: [id number]).  No further social work needs identified at this time.  CSW signing off.    Jacklynn Lewis, MSW, LCSWA  Clinical Social Work [phone]

## 2013-04-06 NOTE — Discharge Summary (Signed)
DISCHARGE SUMMARY  XITLALY AULT  MR#: [id number]  DOB:Jan 30, 1916  Date of Admission: 04/03/2013 Date of Discharge: 04/06/2013  Attending Physician:Jozi Malachi,W DOUGLAS  Patient's PCP:No primary provider on file.  Consults:  PT/OT  Discharge Diagnoses: Principal Problem:   Urinary tract infection Active Problems:   Weakness   Alzheimer's dementia   Cough   Right leg pain  Past Medical History  Severe Alzheimer's Dementia  DM2 HTN Hyperlipidemia Hypothyroidism Vitamin D deficiency.   Osteoporosis with T12 compression fracture Mod-Severe AS (2008)  Past Surgical History  Procedure Laterality Date  . Abdominal hysterectomy  1946    partial    Discharge Medications:   Medication List    TAKE these medications       acetaminophen 325 MG tablet  Commonly known as:  TYLENOL  Take 2 tablets (650 mg total) by mouth every 6 (six) hours as needed.     ciprofloxacin 500 MG tablet  Commonly known as:  CIPRO  Take 1 tablet (500 mg total) by mouth 2 (two) times daily.     dextromethorphan-guaiFENesin 30-600 MG per 12 hr tablet  Commonly known as:  MUCINEX DM  Take 1 tablet by mouth 2 (two) times daily.     feeding supplement Liqd  Take 237 mLs by mouth 2 (two) times daily between meals.     levothyroxine 75 MCG tablet  Commonly known as:  SYNTHROID, LEVOTHROID  Take 75 mcg by mouth daily before breakfast.     LORazepam 0.5 MG tablet  Commonly known as:  ATIVAN  Take 1 tablet (0.5 mg total) by mouth every 8 (eight) hours as needed for anxiety.     metFORMIN 500 MG tablet  Commonly known as:  GLUCOPHAGE  Take 500 mg by mouth 2 (two) times daily with a meal.     traMADol 50 MG tablet  Commonly known as:  ULTRAM  Take 50 mg by mouth every 6 (six) hours as needed for pain.        Hospital Procedures: Dg Chest 2 View  04/05/2013   *RADIOLOGY REPORT*  Clinical Data: Cough, congestion, history diabetes, hypertension  CHEST - 2 VIEW  Comparison: 04/03/2013  Findings:  Minimal enlargement of cardiac silhouette. Calcified tortuous aorta. Pulmonary vascularity normal. Minimal atelectasis at left base. Lungs appear emphysematous with scattered peribronchial thickening. No definite acute infiltrate, pleural effusion or pneumothorax. Diffuse osseous demineralization.  IMPRESSION: Emphysematous and bronchitic changes with minimal left basilar atelectasis.   Original Report Authenticated By: Ulyses Southward, M.D.   Dg Chest 2 View  04/03/2013   *RADIOLOGY REPORT*  Clinical Data: Pain, confused.  CHEST - 2 VIEW  Comparison: 01/09/2011  Findings: Heart is borderline in size.  Tortuosity of the thoracic aorta.  Bibasilar densities likely reflect scarring.  No acute opacities.  No effusions.  No acute bony abnormality.  IMPRESSION: No acute cardiopulmonary disease.   Original Report Authenticated By: Charlett Nose, M.D.   Ct Head Wo Contrast  04/03/2013   *RADIOLOGY REPORT*  Clinical Data: Fall, weakness.  Dimension.  CT HEAD WITHOUT CONTRAST  Technique:  Contiguous axial images were obtained from the base of the skull through the vertex without contrast.  Comparison: 02/25/2010  Findings: Atrophy, chronic microvascular changes.  Old lacunar infarct in the left basal ganglia. No acute intracranial abnormality.  Specifically, no hemorrhage, hydrocephalus, mass lesion, acute infarction, or significant intracranial injury.  No acute calvarial abnormality. Visualized paranasal sinuses and mastoids clear.  Orbital soft tissues unremarkable.  IMPRESSION: No acute intracranial abnormality.  Atrophy, chronic microvascular disease.  Old left basal ganglia lacunar infarct.   Original Report Authenticated By: Charlett Nose, M.D.    History of Present Illness: Ms. Carrender is a 79 year old white female with a history of severe Alzheimer's dementia, hypothyroidism, and diabetes who presented to the emergency department with weakness. The patient has had a long history of progressive severe Alzheimer's  dementia. She was previously on hospice care but was discharged due to stability of her disease. At baseline, her husband is able to care for her at home with 10 hours of in-home care. He has been adamant about keeping her at home. She ambulates with a seated walker and is able to transfer with assistance. Over the past several days, her husband has noticed increased weakness and difficulty transferring. Last night, she went to the restroom 3 times and was unable to void. The third time she fell when trying to transfer. No apparent injuries. Today, she is unable to bear weight due to generalized weakness prompting them to bring her to the emergency department. In the emergency department she had a head CT that showed no acute findings. Laboratory evaluation was significant for urinary tract infection. Of note, she has recently stopped taking her Synthroid as her husband did not feel that it was helping. There have been no other medication changes. She uses Ativan sparingly.    Hospital Course: Mrs. Belardo was admitted to a medical bed. She was placed empirically on Rocephin for her urinary tract infection. Urine culture subsequently grew greater than 100,000 Klebsiella sensitive to Rocephin and Cipro. She was gently hydrated for mild dehydration. Physical therapy and occupational therapy evaluated her and felt that she would benefit from skilled nursing facility rehabilitation prior to return home. Her husband has been very committed to keeping her at home and is hopeful that he can bring her home soon; however, at this time it appears that she will require rehabilitation. The therapist did note that she is having difficulty bearing weight on her right leg and appears to be in pain. Because of her minimal verbal interaction and x-rays being obtained to rule out fracture. She appears to have normal strength and good range of motion his leg without any pain on my exam.  She has had a cough throughout this  hospitalization. Chest x-ray shows no acute cardiopulmonary disease. This is being treated supportively with Mucinex DM in addition to the antibiotic coverage for her UTI.  At this point, she is stable for discharge to a skilled nursing is a pending x-rays of her pelvis and hip.  Day of Discharge Exam BP 147/70  Pulse 88  Temp(Src) 98.7 F (37.1 C) (Oral)  Resp 18  Ht 5\' 3"  (1.6 m)  Wt 43.9 kg (96 lb 12.5 oz)  BMI 17.15 kg/m2  SpO2 94%  Physical Exam: General appearance: Awake, cooperative in no acute distress; pleasantly demented/disoriented Eyes: no scleral icterus Throat: oropharynx moist without erythema Resp: Minimal expiratory wheezing bilaterally. Cardio: regular rate and rhythm and Grade 3/6 systolic ejection murmur over left upper sternal border GI: soft, non-tender; bowel sounds normal; no masses,  no organomegaly Extremities: no clubbing, cyanosis or edema Neurologic: No focal neurologic deficits. Moves all extremities with no apparent weakness  Discharge Labs:  Recent Labs  04/03/13 0940  NA 133*  K 3.7  CL 95*  CO2 26  GLUCOSE 179*  BUN 19  CREATININE 0.90  CALCIUM 9.9    Recent Labs  04/03/13 0940  AST 21  ALT 13  ALKPHOS 98  BILITOT 0.4  PROT 7.6  ALBUMIN 3.6    Recent Labs  04/03/13 0940  WBC 8.0  HGB 12.3  HCT 37.3  MCV 81.6  PLT 204    Recent Labs  04/03/13 0940  TROPONINI <0.30    Recent Labs  04/03/13 2027  TSH 2.289   Urine culture-greater than 100,000 Klebsiella sensitive to Rocephin and Cipro Hemoglobin A1c 6.7  Discharge instructions:     Discharge Orders   Future Orders Complete By Expires     Diet Carb Modified  As directed     Discharge instructions  As directed     Comments:      Continue PT/OT as tolerated.  Fall precautions!    Increase activity slowly  As directed        Disposition: To [facility] skilled nursing facility for rehabilitation  Follow-up Appts: Follow-up with Dr. Clelia Croft at [facility] one week after discharge from skilled nursing facility. Call for appointment within 24 hours of discharge.  Condition on Discharge: Stable  Tests Needing Follow-up: None  CODE STATUS: DO NOT RESUSCITATE  Signed: Donielle Radziewicz,W DOUGLAS 04/06/2013, 6:52 AM

## 2013-04-06 NOTE — Progress Notes (Signed)
Report called to Burna Mortimer at [facility].  Philomena Doheny RN

## 2013-04-09 ENCOUNTER — Non-Acute Institutional Stay (SKILLED_NURSING_FACILITY): Payer: Medicare Other | Admitting: Internal Medicine

## 2013-04-09 DIAGNOSIS — G309 Alzheimer's disease, unspecified: Secondary | ICD-10-CM

## 2013-04-09 DIAGNOSIS — R5381 Other malaise: Secondary | ICD-10-CM

## 2013-04-09 DIAGNOSIS — K5900 Constipation, unspecified: Secondary | ICD-10-CM

## 2013-04-09 DIAGNOSIS — R638 Other symptoms and signs concerning food and fluid intake: Secondary | ICD-10-CM

## 2013-04-09 DIAGNOSIS — F0280 Dementia in other diseases classified elsewhere without behavioral disturbance: Secondary | ICD-10-CM

## 2013-04-09 DIAGNOSIS — N390 Urinary tract infection, site not specified: Secondary | ICD-10-CM

## 2013-04-09 DIAGNOSIS — R531 Weakness: Secondary | ICD-10-CM

## 2013-04-09 NOTE — Progress Notes (Signed)
Patient ID: Anne [name], female   DOB: [birth year], 79 y.o.   MRN: [id number]   PCP: No primary provider on file.  Code Status: dnr  No Known Allergies  Chief Complaint: new admit post hospital admission 04/03/13- 04/06/13  HPI:  79 y/o female patient with history of alzhimers dementia, diabetes, HTN, hyperlipidemia, moderate- severe AS among other medical problems presented to the ED with weakness. Ct head showed no new findings. She was empirically started on rocephin after sending urine studies and it grew klebsiella later.  She also received iv fluids rehab was recommended given her weakness and she was sent to SNF She was seen in her room today with her husband present. She is in no distress.she is on oxygen by nasal canula which was started in hospital. She has been working with therapy team. As per husband she is having trouble with her bowel movement and was taking miralax at home. She has poor po intake.  Review of Systems  Constitutional: Negative for fever and chills.  HENT: Negative for congestion.   Eyes: Negative for blurred vision.  Respiratory: Negative for cough and shortness of breath.   Cardiovascular: Negative for chest pain, palpitations and leg swelling.  Gastrointestinal: Positive for constipation. Negative for heartburn, nausea, vomiting and abdominal pain.  Genitourinary: Negative for dysuria.  Skin: Negative for rash.  Neurological: Positive for weakness. Negative for dizziness, focal weakness and headaches.  Psychiatric/Behavioral: Positive for memory loss. Negative for depression.    Past Medical History  Diagnosis Date  . Diabetes mellitus   . Dementia   . Thyroid disease   . Hypertension    Past Surgical History  Procedure Laterality Date  . Abdominal hysterectomy  1946    partial   Social History:   reports that she has never smoked. She has never used smokeless tobacco. She reports that she does not drink alcohol or use illicit drugs.  Family  History  Problem Relation Age of Onset  . Stroke Mother   . Other Father     accident    Medications: Patient's Medications  New Prescriptions   No medications on file  Previous Medications   ACETAMINOPHEN (TYLENOL) 325 MG TABLET    Take 2 tablets (650 mg total) by mouth every 6 (six) hours as needed.   CIPROFLOXACIN (CIPRO) 500 MG TABLET    Take 1 tablet (500 mg total) by mouth 2 (two) times daily.   DEXTROMETHORPHAN-GUAIFENESIN (MUCINEX DM) 30-600 MG PER 12 HR TABLET    Take 1 tablet by mouth 2 (two) times daily.   FEEDING SUPPLEMENT (ENSURE COMPLETE) LIQD    Take 237 mLs by mouth 2 (two) times daily between meals.   LEVOTHYROXINE (SYNTHROID, LEVOTHROID) 75 MCG TABLET    Take 75 mcg by mouth daily before breakfast.   LORAZEPAM (ATIVAN) 0.5 MG TABLET    Take 1 tablet (0.5 mg total) by mouth every 12 (twelve) hours.   METFORMIN (GLUCOPHAGE) 500 MG TABLET    Take 500 mg by mouth 2 (two) times daily with a meal.   TRAMADOL (ULTRAM) 50 MG TABLET    Take 50 mg by mouth every 6 (six) hours as needed for pain.  Modified Medications   No medications on file  Discontinued Medications   No medications on file     Physical Exam:  Filed Vitals:   04/09/13 1809  BP: 140/81  Pulse: 75  Temp: 97.3 F (36.3 C)  Resp: 18  SpO2: 96%   gen- frail, elderly  female in NAD, on o2 by nasal canula heent- no pallor, mmm, no jvd cvs- grade 3/6 systolic murmur, noral s1,s2 respi- b/l cta, minimal wheeze Gi- soft, non tender, bs+ ext- able to move all 4, weakness present, no edema Neuro- no focal deficit, aaox 2  Labs reviewed: Basic Metabolic Panel:  Recent Labs  19/14/78 0940  NA 133*  K 3.7  CL 95*  CO2 26  GLUCOSE 179*  BUN 19  CREATININE 0.90  CALCIUM 9.9   Liver Function Tests:  Recent Labs  04/03/13 0940  AST 21  ALT 13  ALKPHOS 98  BILITOT 0.4  PROT 7.6  ALBUMIN 3.6   No results found for this basename: LIPASE, AMYLASE,  in the last 8760 hours No results found  for this basename: AMMONIA,  in the last 8760 hours CBC:  Recent Labs  04/03/13 0940  WBC 8.0  HGB 12.3  HCT 37.3  MCV 81.6  PLT 204   Cardiac Enzymes:  Recent Labs  04/03/13 0940  TROPONINI <0.30    Radiological Exams: Dg Chest 2 View  04/05/2013   *RADIOLOGY REPORT*  Clinical Data: Cough, congestion, history diabetes, hypertension  CHEST - 2 VIEW  Comparison: 04/03/2013  Findings: Minimal enlargement of cardiac silhouette. Calcified tortuous aorta. Pulmonary vascularity normal. Minimal atelectasis at left base. Lungs appear emphysematous with scattered peribronchial thickening. No definite acute infiltrate, pleural effusion or pneumothorax. Diffuse osseous demineralization.  IMPRESSION: Emphysematous and bronchitic changes with minimal left basilar atelectasis.   Original Report Authenticated By: Ulyses Southward, M.D.   Dg Chest 2 View  04/03/2013   *RADIOLOGY REPORT*  Clinical Data: Pain, confused.  CHEST - 2 VIEW  Comparison: 01/09/2011  Findings: Heart is borderline in size.  Tortuosity of the thoracic aorta.  Bibasilar densities likely reflect scarring.  No acute opacities.  No effusions.  No acute bony abnormality.  IMPRESSION: No acute cardiopulmonary disease.   Original Report Authenticated By: Charlett Nose, M.D.   Ct Head Wo Contrast  04/03/2013   *RADIOLOGY REPORT*  Clinical Data: Fall, weakness.  Dimension.  CT HEAD WITHOUT CONTRAST  Technique:  Contiguous axial images were obtained from the base of the skull through the vertex without contrast.  Comparison: 02/25/2010  Findings: Atrophy, chronic microvascular changes.  Old lacunar infarct in the left basal ganglia. No acute intracranial abnormality.  Specifically, no hemorrhage, hydrocephalus, mass lesion, acute infarction, or significant intracranial injury.  No acute calvarial abnormality. Visualized paranasal sinuses and mastoids clear.  Orbital soft tissues unremarkable.  IMPRESSION: No acute intracranial abnormality.  Atrophy,  chronic microvascular disease.  Old left basal ganglia lacunar infarct.   Original Report Authenticated By: Charlett Nose, M.D.   Assessment/Plan  Constipation- will add miralax daily to her medication, reassess  uti- to complete course of ciprofloxacin on 04/11/13  Poor po intake- add nutritional supplement.- medpass Will get dietary consult. Also SLP consult with concerns for aspiration  Generalized weakness- has dementia, medical illness. Dementia is progressing and she has decreased po intake and has debility. She will need to work with PT/OT as tolerated. With her cough, concerns for mild aspiration. Will take aspiration precautions. Will have SLP evaluate for need for diet consistency change. Will also add nutritional supplement.skin care. Fall precautions for now. Check cbc and cmp  Family/ staff Communication: reviewed care plan with patient, her husband and nursing supervisor  Labs/tests ordered- cbc , cmp

## 2013-04-12 DIAGNOSIS — R638 Other symptoms and signs concerning food and fluid intake: Secondary | ICD-10-CM | POA: Insufficient documentation

## 2013-04-12 DIAGNOSIS — K5909 Other constipation: Secondary | ICD-10-CM | POA: Insufficient documentation

## 2013-10-13 ENCOUNTER — Emergency Department (HOSPITAL_COMMUNITY): Payer: Medicare Other

## 2013-10-13 ENCOUNTER — Encounter (HOSPITAL_COMMUNITY): Payer: Self-pay | Admitting: Emergency Medicine

## 2013-10-13 ENCOUNTER — Inpatient Hospital Stay (HOSPITAL_COMMUNITY)
Admission: EM | Admit: 2013-10-13 | Discharge: 2013-10-16 | DRG: 469 | Disposition: A | Discharge status: Skilled Nursing Facility | Payer: Medicare Other | Attending: Internal Medicine | Admitting: Internal Medicine

## 2013-10-13 DIAGNOSIS — R011 Cardiac murmur, unspecified: Secondary | ICD-10-CM | POA: Diagnosis present

## 2013-10-13 DIAGNOSIS — R9431 Abnormal electrocardiogram [ECG] [EKG]: Secondary | ICD-10-CM | POA: Diagnosis present

## 2013-10-13 DIAGNOSIS — I10 Essential (primary) hypertension: Secondary | ICD-10-CM | POA: Diagnosis present

## 2013-10-13 DIAGNOSIS — E860 Dehydration: Secondary | ICD-10-CM | POA: Diagnosis present

## 2013-10-13 DIAGNOSIS — K5909 Other constipation: Secondary | ICD-10-CM | POA: Diagnosis present

## 2013-10-13 DIAGNOSIS — Y92009 Unspecified place in unspecified non-institutional (private) residence as the place of occurrence of the external cause: Secondary | ICD-10-CM

## 2013-10-13 DIAGNOSIS — IMO0001 Reserved for inherently not codable concepts without codable children: Secondary | ICD-10-CM | POA: Diagnosis present

## 2013-10-13 DIAGNOSIS — Z79899 Other long term (current) drug therapy: Secondary | ICD-10-CM

## 2013-10-13 DIAGNOSIS — I359 Nonrheumatic aortic valve disorder, unspecified: Secondary | ICD-10-CM | POA: Diagnosis present

## 2013-10-13 DIAGNOSIS — E119 Type 2 diabetes mellitus without complications: Secondary | ICD-10-CM | POA: Diagnosis present

## 2013-10-13 DIAGNOSIS — Z66 Do not resuscitate: Secondary | ICD-10-CM | POA: Diagnosis present

## 2013-10-13 DIAGNOSIS — G309 Alzheimer's disease, unspecified: Secondary | ICD-10-CM | POA: Diagnosis present

## 2013-10-13 DIAGNOSIS — K5900 Constipation, unspecified: Secondary | ICD-10-CM | POA: Diagnosis present

## 2013-10-13 DIAGNOSIS — W19XXXA Unspecified fall, initial encounter: Secondary | ICD-10-CM | POA: Diagnosis present

## 2013-10-13 DIAGNOSIS — D62 Acute posthemorrhagic anemia: Secondary | ICD-10-CM | POA: Diagnosis not present

## 2013-10-13 DIAGNOSIS — M899 Disorder of bone, unspecified: Secondary | ICD-10-CM | POA: Diagnosis present

## 2013-10-13 DIAGNOSIS — E079 Disorder of thyroid, unspecified: Secondary | ICD-10-CM | POA: Diagnosis present

## 2013-10-13 DIAGNOSIS — R5381 Other malaise: Secondary | ICD-10-CM | POA: Diagnosis present

## 2013-10-13 DIAGNOSIS — W010XXA Fall on same level from slipping, tripping and stumbling without subsequent striking against object, initial encounter: Secondary | ICD-10-CM

## 2013-10-13 DIAGNOSIS — N390 Urinary tract infection, site not specified: Secondary | ICD-10-CM | POA: Diagnosis present

## 2013-10-13 DIAGNOSIS — S72002A Fracture of unspecified part of neck of left femur, initial encounter for closed fracture: Secondary | ICD-10-CM

## 2013-10-13 DIAGNOSIS — R531 Weakness: Secondary | ICD-10-CM | POA: Diagnosis present

## 2013-10-13 DIAGNOSIS — IMO0002 Reserved for concepts with insufficient information to code with codable children: Secondary | ICD-10-CM | POA: Diagnosis present

## 2013-10-13 DIAGNOSIS — F0280 Dementia in other diseases classified elsewhere without behavioral disturbance: Secondary | ICD-10-CM | POA: Diagnosis present

## 2013-10-13 DIAGNOSIS — E1165 Type 2 diabetes mellitus with hyperglycemia: Secondary | ICD-10-CM | POA: Diagnosis present

## 2013-10-13 DIAGNOSIS — E43 Unspecified severe protein-calorie malnutrition: Secondary | ICD-10-CM | POA: Insufficient documentation

## 2013-10-13 DIAGNOSIS — S72009A Fracture of unspecified part of neck of unspecified femur, initial encounter for closed fracture: Principal | ICD-10-CM

## 2013-10-13 DIAGNOSIS — Z681 Body mass index (BMI) 19 or less, adult: Secondary | ICD-10-CM

## 2013-10-13 DIAGNOSIS — F0390 Unspecified dementia without behavioral disturbance: Secondary | ICD-10-CM

## 2013-10-13 HISTORY — DX: Cardiac murmur, unspecified: R01.1

## 2013-10-13 LAB — CBC
HCT: 36 % (ref 36.0–46.0)
Hemoglobin: 11.9 g/dL — ABNORMAL LOW (ref 12.0–15.0)
MCH: 27.2 pg (ref 26.0–34.0)
MCHC: 33.1 g/dL (ref 30.0–36.0)
MCV: 82.2 fL (ref 78.0–100.0)

## 2013-10-13 LAB — TYPE AND SCREEN
ABO/RH(D): O POS
Antibody Screen: NEGATIVE

## 2013-10-13 LAB — COMPREHENSIVE METABOLIC PANEL
Alkaline Phosphatase: 88 U/L (ref 39–117)
BUN: 32 mg/dL — ABNORMAL HIGH (ref 6–23)
Calcium: 9.6 mg/dL (ref 8.4–10.5)
Creatinine, Ser: 1.07 mg/dL (ref 0.50–1.10)
GFR calc Af Amer: 50 mL/min — ABNORMAL LOW (ref >90)
Glucose, Bld: 276 mg/dL — ABNORMAL HIGH (ref 70–99)
Potassium: 3.7 mEq/L (ref 3.5–5.1)
Total Protein: 7.1 g/dL (ref 6.0–8.3)

## 2013-10-13 LAB — URINALYSIS, ROUTINE W REFLEX MICROSCOPIC
Ketones, ur: NEGATIVE mg/dL
Leukocytes, UA: NEGATIVE
Nitrite: POSITIVE — AB
pH: 6.5 (ref 5.0–8.0)

## 2013-10-13 LAB — ABO/RH: ABO/RH(D): O POS

## 2013-10-13 LAB — PROTIME-INR: INR: 1.01 (ref 0.00–1.49)

## 2013-10-13 LAB — APTT: aPTT: 22 seconds — ABNORMAL LOW (ref 24–37)

## 2013-10-13 LAB — URINE MICROSCOPIC-ADD ON

## 2013-10-13 LAB — VALPROIC ACID LEVEL: Valproic Acid Lvl: 20.5 ug/mL — ABNORMAL LOW (ref 50.0–100.0)

## 2013-10-13 MED ORDER — SORBITOL 70 % SOLN
30.0000 mL | Freq: Every day | Status: DC | PRN
  Filled 2013-10-13: qty 30

## 2013-10-13 MED ORDER — DEXTROSE 5 % IV SOLN
1.0000 g | INTRAVENOUS | Status: DC
  Administered 2013-10-13 – 2013-10-15 (×3): 1 g via INTRAVENOUS
  Filled 2013-10-13 (×4): qty 10

## 2013-10-13 MED ORDER — VALPROATE SODIUM 500 MG/5ML IV SOLN
250.0000 mg | Freq: Two times a day (BID) | INTRAVENOUS | Status: DC
  Administered 2013-10-14 – 2013-10-16 (×6): 250 mg via INTRAVENOUS
  Filled 2013-10-13 (×7): qty 2.5

## 2013-10-13 MED ORDER — MORPHINE SULFATE 2 MG/ML IJ SOLN
2.0000 mg | Freq: Once | INTRAMUSCULAR | Status: AC
  Administered 2013-10-13: 2 mg via INTRAVENOUS
  Filled 2013-10-13: qty 1

## 2013-10-13 MED ORDER — INSULIN ASPART 100 UNIT/ML ~~LOC~~ SOLN
0.0000 [IU] | Freq: Three times a day (TID) | SUBCUTANEOUS | Status: DC
  Administered 2013-10-15 – 2013-10-16 (×5): 2 [IU] via SUBCUTANEOUS
  Administered 2013-10-16: 3 [IU] via SUBCUTANEOUS

## 2013-10-13 MED ORDER — SODIUM CHLORIDE 0.9 % IV SOLN
INTRAVENOUS | Status: DC
  Administered 2013-10-13: 19:00:00 via INTRAVENOUS

## 2013-10-13 MED ORDER — PANTOPRAZOLE SODIUM 40 MG PO TBEC
40.0000 mg | DELAYED_RELEASE_TABLET | Freq: Every day | ORAL | Status: DC
  Administered 2013-10-15 – 2013-10-16 (×2): 40 mg via ORAL
  Filled 2013-10-13 (×4): qty 1

## 2013-10-13 MED ORDER — MORPHINE SULFATE 2 MG/ML IJ SOLN
0.5000 mg | INTRAMUSCULAR | Status: DC | PRN
  Administered 2013-10-13 – 2013-10-14 (×2): 0.5 mg via INTRAVENOUS
  Filled 2013-10-13 (×2): qty 1

## 2013-10-13 MED ORDER — DOCUSATE SODIUM 100 MG PO CAPS
100.0000 mg | ORAL_CAPSULE | Freq: Two times a day (BID) | ORAL | Status: DC
  Administered 2013-10-15 – 2013-10-16 (×3): 100 mg via ORAL
  Filled 2013-10-13 (×7): qty 1

## 2013-10-13 MED ORDER — ENSURE COMPLETE PO LIQD
237.0000 mL | Freq: Two times a day (BID) | ORAL | Status: DC
  Administered 2013-10-14 – 2013-10-15 (×2): 237 mL via ORAL

## 2013-10-13 MED ORDER — DIVALPROEX SODIUM 125 MG PO CPSP
250.0000 mg | ORAL_CAPSULE | Freq: Two times a day (BID) | ORAL | Status: DC
  Filled 2013-10-13: qty 2

## 2013-10-13 MED ORDER — TRAMADOL HCL 50 MG PO TABS: 50.0000 mg | ORAL_TABLET | Freq: Four times a day (QID) | ORAL | Status: DC | PRN

## 2013-10-13 MED ORDER — SODIUM CHLORIDE 0.9 % IV SOLN
INTRAVENOUS | Status: AC
  Administered 2013-10-13: 18:00:00 via INTRAVENOUS

## 2013-10-13 MED ORDER — POLYETHYLENE GLYCOL 3350 17 G PO PACK
17.0000 g | PACK | Freq: Every day | ORAL | Status: DC | PRN
  Filled 2013-10-13: qty 1

## 2013-10-13 MED ORDER — LORAZEPAM 0.5 MG PO TABS
0.5000 mg | ORAL_TABLET | Freq: Two times a day (BID) | ORAL | Status: DC
  Filled 2013-10-13: qty 1

## 2013-10-13 MED ORDER — MAGNESIUM CITRATE PO SOLN: 1.0000 | Freq: Once | ORAL | Status: AC | PRN

## 2013-10-13 NOTE — ED Notes (Signed)
Pt transported to [facility]

## 2013-10-13 NOTE — ED Notes (Addendum)
Husband reports the patient fell twice in the past two weeks ago. Husband reports she found the patient laying in the floor asleep last night and suspects she fell. Pt reports she was dragging her left leg and wouldn't bear weight on the leg. Family reports the patient reported pain midway between the left hip and knee. Pt has a older hematoma to the left side of her forehead, which the patient reports being seen by his PCP.

## 2013-10-13 NOTE — Consult Note (Signed)
Reason for Consult: Left femoral neck fracture Referring Physician: Donye Dauenhauer is an 79 y.o. female.  HPI: Patient is a 79 year old woman with multiple medical problems including diabetes hypertension Alzheimer's thyroid disease chronic cardiac murmur who presents with acute left femoral neck fracture. Patient was found down on the floor early in the morning.  Past Medical History  Diagnosis Date  . Diabetes mellitus   . Dementia   . Thyroid disease   . Hypertension   . Murmur, heart 10/13/2013    Past Surgical History  Procedure Laterality Date  . Abdominal hysterectomy  1946    partial    Family History  Problem Relation Age of Onset  . Stroke Mother   . Other Father     accident    Social History:  reports that she has never smoked. She has never used smokeless tobacco. She reports that she does not drink alcohol or use illicit drugs.  Allergies: No Known Allergies  Medications: I have reviewed the patient's current medications.  Results for orders placed during the hospital encounter of 10/13/13 (from the past 48 hour(s))  CBC     Status: Abnormal   Collection Time    10/13/13  4:31 PM      Result Value Range   WBC 10.9 (*) 4.0 - 10.5 K/uL   RBC 4.38  3.87 - 5.11 MIL/uL   Hemoglobin 11.9 (*) 12.0 - 15.0 g/dL   HCT 21.3  08.6 - 57.8 %   MCV 82.2  78.0 - 100.0 fL   MCH 27.2  26.0 - 34.0 pg   MCHC 33.1  30.0 - 36.0 g/dL   RDW 46.9  62.9 - 52.8 %   Platelets 217  150 - 400 K/uL  COMPREHENSIVE METABOLIC PANEL     Status: Abnormal   Collection Time    10/13/13  4:31 PM      Result Value Range   Sodium 132 (*) 135 - 145 mEq/L   Potassium 3.7  3.5 - 5.1 mEq/L   Chloride 98  96 - 112 mEq/L   CO2 21  19 - 32 mEq/L   Glucose, Bld 276 (*) 70 - 99 mg/dL   BUN 32 (*) 6 - 23 mg/dL   Creatinine, Ser 4.13  0.50 - 1.10 mg/dL   Calcium 9.6  8.4 - 24.4 mg/dL   Total Protein 7.1  6.0 - 8.3 g/dL   Albumin 3.4 (*) 3.5 - 5.2 g/dL   AST 17  0 - 37 U/L   ALT 12   0 - 35 U/L   Alkaline Phosphatase 88  39 - 117 U/L   Total Bilirubin 0.4  0.3 - 1.2 mg/dL   GFR calc non Af Amer 43 (*) >90 mL/min   GFR calc Af Amer 50 (*) >90 mL/min   Comment: (NOTE)     The eGFR has been calculated using the CKD EPI equation.     This calculation has not been validated in all clinical situations.     eGFR's persistently <90 mL/min signify possible Chronic Kidney     Disease.  VALPROIC ACID LEVEL     Status: Abnormal   Collection Time    10/13/13  4:31 PM      Result Value Range   Valproic Acid Lvl 20.5 (*) 50.0 - 100.0 ug/mL   Comment: Performed at [facility]  URINALYSIS, ROUTINE W REFLEX MICROSCOPIC     Status: Abnormal   Collection Time    10/13/13  5:05 PM      Result Value Range   Color, Urine YELLOW  YELLOW   APPearance CLEAR  CLEAR   Specific Gravity, Urine 1.022  1.005 - 1.030   pH 6.5  5.0 - 8.0   Glucose, UA 500 (*) NEGATIVE mg/dL   Hgb urine dipstick MODERATE (*) NEGATIVE   Bilirubin Urine NEGATIVE  NEGATIVE   Ketones, ur NEGATIVE  NEGATIVE mg/dL   Protein, ur 30 (*) NEGATIVE mg/dL   Urobilinogen, UA 0.2  0.0 - 1.0 mg/dL   Nitrite POSITIVE (*) NEGATIVE   Leukocytes, UA NEGATIVE  NEGATIVE  URINE MICROSCOPIC-ADD ON     Status: None   Collection Time    10/13/13  5:05 PM      Result Value Range   Squamous Epithelial / LPF RARE  RARE   WBC, UA 0-2  <3 WBC/hpf   RBC / HPF 7-10  <3 RBC/hpf   Bacteria, UA RARE  RARE    Dg Chest 1 View  10/13/2013   CLINICAL DATA:  Fall, left hip pain  EXAM: CHEST - 1 VIEW  COMPARISON:  04/05/2013  FINDINGS: Cardiomediastinal silhouette is stable. No acute infiltrate or pleural effusion. No pulmonary edema. No diagnostic pneumothorax.  IMPRESSION: No active disease.  No diagnostic pneumothorax.   Electronically Signed   By: Natasha Mead M.D.   On: 10/13/2013 16:01   Dg Pelvis 1-2 Views  10/13/2013   CLINICAL DATA:  Left hip pain following a fall.  EXAM: PELVIS - 1-2 VIEW  COMPARISON:  Abdomen radiographs  dated 10/16/2006.  FINDINGS: Left femoral neck fracture with varus angulation and proximal displacement of the distal fragment. Diffuse osteopenia. Atheromatous arterial calcifications. Lower lumbar spine scoliosis.  IMPRESSION: Left femoral neck fracture, as described above.   Electronically Signed   By: Gordan Payment M.D.   On: 10/13/2013 16:03   Dg Femur Left  10/13/2013   CLINICAL DATA:  Left hip pain following a fall.  EXAM: LEFT FEMUR - 2 VIEW  COMPARISON:  Left hip radiographs obtained at the same time.  FINDINGS: Previously described left femoral neck fracture. No additional fractures or dislocation. Diffuse osteopenia.  IMPRESSION: Previously described left femoral neck fracture. Diffuse osteopenia.   Electronically Signed   By: Gordan Payment M.D.   On: 10/13/2013 16:03   Ct Head Wo Contrast  10/13/2013   CLINICAL DATA:  Fall, dementia  EXAM: CT HEAD WITHOUT CONTRAST  TECHNIQUE: Contiguous axial images were obtained from the base of the skull through the vertex without intravenous contrast.  COMPARISON:  04/03/2013  FINDINGS: No skull fracture is noted. Paranasal sinuses and mastoid air cells are unremarkable. No intracranial hemorrhage, mass effect or midline shift. Stable lacunar infarct in left basal ganglia. Stable atrophy and chronic white matter disease. No acute cortical infarction. No mass lesion is noted on this unenhanced scan.  IMPRESSION: No acute intracranial abnormality. Stable atrophy and chronic white matter disease. Again noted small lacunar infarct in left basal ganglia.   Electronically Signed   By: Natasha Mead M.D.   On: 10/13/2013 16:06    Review of Systems  All other systems reviewed and are negative.   Blood pressure 179/96, pulse 94, temperature 97.8 F (36.6 C), temperature source Oral, resp. rate 16, height 5\' 3"  (1.6 m), weight 43.6 kg (96 lb 1.9 oz), SpO2 96.00%. Physical Exam On examination patient is oriented she does have pain with internal and external rotation of  the left hip. Radiograph shows an impacted displaced  left femoral neck fracture.  Assessment/Plan: Assessment: Impacted displaced left femoral neck fracture.  Plan: Treatment options were discussed with the patient's son. Nonoperative versus operative intervention were discussed. Discussed that with operative intervention this would significantly decrease her pain. Feel that with either operative or nonoperative treatment patient would most likely not walk again. Discussed that without operative intervention her mortality risk was most likely higher. But also with her multiple medical problems patient was at increased risk of mortality with surgical intervention. Discussed that there was not a clear option for operative versus nonoperative intervention. Patient's son states he understands and feels that the option of operative intervention to potentially provider increased pain relief was the best option. We will plan for surgical intervention tomorrow morning. Risks including infection neurovascular injury dislocation DVT pulmonary embolus mortality were discussed patient's son states he understands and wished to proceed at this time.   Mertice Uffelman V 10/13/2013, 7:27 PM

## 2013-10-13 NOTE — H&P (Signed)
[facility] History and Physical  Anne [name] AOZ:[id number] DOB: 04-14-1916 DOA: 10/13/2013  Referring physician: Dr Denton Lank PCP: Kari Baars, MD  Specialists:   Chief Complaint: Left hip pain  HPI: Anne [name] is a 79 y.o. female  With history of diabetes mellitus diet controlled, dementia, thyroid disease currently off any thyroid medications, history of a murmur per family, hypertension currently not on any medications as an ED with left hip pain after a fall. Patient currently sleeping after receiving some morphine however easily arousable with some confusion and a such history was obtained from patient's family. Her husband and son and husband stated that found patient on the floor and then around 2 AM on the morning of admission and patient was sleeping on the floor. He awoke in the patient and she was complaining of left lower extremity pain. Husband was able to get the patient on the floor and noted that patient was unable to bear weight. Patient was subsequently brought to the ED for further evaluation. Family does endorse that patient has had about a 14 pound weight loss over the past 6 months and has been eating and drinking is a normal routine. No syncope noted. No fever, no chills, no nausea, no vomiting, no chest pain, no shortness of breath, no abdominal pain, no diarrhea, no constipation. Family states patient has been on MiraLAX daily. Family does endorse some dysuria. Patient was seen in the emergency room compressive metabolic profile obtain a sodium of 132 BUN of 30 to glucose of 276 albumin of 3.4 otherwise was within normal limits. CBC white count of 10.9 hemoglobin of 11.9 otherwise was within normal limits. Valproic acid level is 20.5. Chest x-ray done was negative for any infiltrate. X-ray of the left femur showed left femoral neck fracture with diffuse osteopenia. Plain films of the pelvis showed left femoral neck fracture. ED physician stated he spoke with Dr.  Lajoyce Corners of orthopedics on call who stated he will consult on the patient. We were called to admit the patient for further evaluation and management.  Review of Systems: The patient denies anorexia, fever, weight loss,, vision loss, decreased hearing, hoarseness, chest pain, syncope, dyspnea on exertion, peripheral edema, balance deficits, hemoptysis, abdominal pain, melena, hematochezia, severe indigestion/heartburn, hematuria, incontinence, genital sores, muscle weakness, suspicious skin lesions, transient blindness, difficulty walking, depression, unusual weight change, abnormal bleeding, enlarged lymph nodes, angioedema, and breast masses.   Past Medical History  Diagnosis Date  . Diabetes mellitus   . Dementia   . Thyroid disease   . Hypertension   . Murmur, heart 10/13/2013   Past Surgical History  Procedure Laterality Date  . Abdominal hysterectomy  1946    partial   Social History:  reports that she has never smoked. She has never used smokeless tobacco. She reports that she does not drink alcohol or use illicit drugs.  No Known Allergies  Family History  Problem Relation Age of Onset  . Stroke Mother   . Other Father     accident    Prior to Admission medications   Medication Sig Start Date End Date Taking? Authorizing Provider  divalproex (DEPAKOTE SPRINKLE) 125 MG capsule Take 250 mg by mouth 2 (two) times daily.  10/11/13  Yes Historical Provider, MD  feeding supplement (ENSURE COMPLETE) LIQD Take 237 mLs by mouth 2 (two) times daily between meals. 04/06/13  Yes Kari Baars, MD  LORazepam (ATIVAN) 0.5 MG tablet Take 0.5 mg by mouth every 12 (twelve) hours.  Yes Historical Provider, MD  traMADol (ULTRAM) 50 MG tablet Take 50 mg by mouth every 6 (six) hours as needed for pain.   Yes Historical Provider, MD   Physical Exam: Filed Vitals:   10/13/13 1827  BP: 179/96  Pulse: 94  Temp: 97.8 F (36.6 C)  Resp: 16     General:  Elderly frail female laying on bed in no  acute cardiopulmonary distress. Easily arousable.  Eyes: Pupils equal round and reactive to light and accommodation. Extraocular movements intact.  ENT: Oropharynx is clear, no lesions, no exudates. Dry mucous membranes.  Neck: Supple with no lymphadenopathy. No JVD.  Cardiovascular: Regular rate and rhythm with 3/6 systolic ejection murmur.  Respiratory: Clear to auscultation bilaterally in anterior lung fields. No wheezing, no crackles, no rhonchi.  Abdomen: Soft, nontender, nondistended, positive bowel sounds.  Skin: No rashes or lesions. Bruise noted on right for head.  Musculoskeletal: Left lower extremity is externally rotated with some tenderness to palpation in the left upper thigh.  Psychiatric: Alert. Profuse. Normal motor. Normal affect. Poor insight. Poor judgment.  Neurologic: Alert. Pleasantly confused. Cranial nerves II through XII are grossly intact. Sensation is intact. Moving extremities spontaneously.  Labs on Admission:  Basic Metabolic Panel:  Recent Labs Lab 10/13/13 1631  NA 132*  K 3.7  CL 98  CO2 21  GLUCOSE 276*  BUN 32*  CREATININE 1.07  CALCIUM 9.6   Liver Function Tests:  Recent Labs Lab 10/13/13 1631  AST 17  ALT 12  ALKPHOS 88  BILITOT 0.4  PROT 7.1  ALBUMIN 3.4*   No results found for this basename: LIPASE, AMYLASE,  in the last 168 hours No results found for this basename: AMMONIA,  in the last 168 hours CBC:  Recent Labs Lab 10/13/13 1631  WBC 10.9*  HGB 11.9*  HCT 36.0  MCV 82.2  PLT 217   Cardiac Enzymes: No results found for this basename: CKTOTAL, CKMB, CKMBINDEX, TROPONINI,  in the last 168 hours  BNP (last 3 results) No results found for this basename: PROBNP,  in the last 8760 hours CBG: No results found for this basename: GLUCAP,  in the last 168 hours  Radiological Exams on Admission: Dg Chest 1 View  10/13/2013   CLINICAL DATA:  Fall, left hip pain  EXAM: CHEST - 1 VIEW  COMPARISON:  04/05/2013   FINDINGS: Cardiomediastinal silhouette is stable. No acute infiltrate or pleural effusion. No pulmonary edema. No diagnostic pneumothorax.  IMPRESSION: No active disease.  No diagnostic pneumothorax.   Electronically Signed   By: Natasha Mead M.D.   On: 10/13/2013 16:01   Dg Pelvis 1-2 Views  10/13/2013   CLINICAL DATA:  Left hip pain following a fall.  EXAM: PELVIS - 1-2 VIEW  COMPARISON:  Abdomen radiographs dated 10/16/2006.  FINDINGS: Left femoral neck fracture with varus angulation and proximal displacement of the distal fragment. Diffuse osteopenia. Atheromatous arterial calcifications. Lower lumbar spine scoliosis.  IMPRESSION: Left femoral neck fracture, as described above.   Electronically Signed   By: Gordan Payment M.D.   On: 10/13/2013 16:03   Dg Femur Left  10/13/2013   CLINICAL DATA:  Left hip pain following a fall.  EXAM: LEFT FEMUR - 2 VIEW  COMPARISON:  Left hip radiographs obtained at the same time.  FINDINGS: Previously described left femoral neck fracture. No additional fractures or dislocation. Diffuse osteopenia.  IMPRESSION: Previously described left femoral neck fracture. Diffuse osteopenia.   Electronically Signed   By: Brett Canales  Azucena Kuba M.D.   On: 10/13/2013 16:03   Ct Head Wo Contrast  10/13/2013   CLINICAL DATA:  Fall, dementia  EXAM: CT HEAD WITHOUT CONTRAST  TECHNIQUE: Contiguous axial images were obtained from the base of the skull through the vertex without intravenous contrast.  COMPARISON:  04/03/2013  FINDINGS: No skull fracture is noted. Paranasal sinuses and mastoid air cells are unremarkable. No intracranial hemorrhage, mass effect or midline shift. Stable lacunar infarct in left basal ganglia. Stable atrophy and chronic white matter disease. No acute cortical infarction. No mass lesion is noted on this unenhanced scan.  IMPRESSION: No acute intracranial abnormality. Stable atrophy and chronic white matter disease. Again noted small lacunar infarct in left basal ganglia.    Electronically Signed   By: Natasha Mead M.D.   On: 10/13/2013 16:06    EKG: Independently reviewed. Normal sinus rhythm with some PACs. QTC prolongation.  Assessment/Plan Principal Problem:   Left displaced femoral neck fracture Active Problems:   Weakness   Urinary tract infection   Alzheimer's dementia   Unspecified constipation   Thyroid disease   HTN (hypertension)   Dehydration   Diabetes mellitus   Murmur, heart   #1 left displaced femoral neck fracture Likely secondary to mechanical fall. Patient with no cardiac history. No recent chest pain or shortness of breath. Patient however does have a murmur on exam which family states is chronic. Patient is moderate to high-risk for surgery based on age and also possibly secondary to this murmur which may be secondary to aortic insufficiency versus stenosis. Will check a 2-D echo. Patient however regardless of aortic stenosis is likely not a surgical candidate for repair and a such will not change management. If patient does not have surgery will likely be non-ambulatory and decline rapidly. Patient should be okay for surgery the family accepts the risks and complications. Orthopedics have been consulted and will assess the patient. Will keep patient currently n.p.o. until assessed by orthopedics.  #2 dehydration IV fluids.  #3 heart murmur Per family this has been chronic. Will check a 2-D echo. Follow.  #4 urinary tract infection Urine cultures are pending. We'll place on IV Rocephin.  #5 hypertension Patient currently on not on any antihypertensive medications. Will monitor for now.  #6 diabetes mellitus Currently diet controlled per family. Patient's hemoglobin A1c was 6.7 in May of 2014. Place on a sliding scale insulin. Follow.  #7 Alzheimer's dementia Continue Depakote. Continue Ativan.  #8 thyroid disease Per family patient has been off thyroid medications. Will check a TSH. Will likely need outpatient followup.  #9  QTC prolongation Patient is not on any antipsychotics. Will repeat EKG in the morning.  #10 prophylaxis PPI for GI prophylaxis. Will defer DVT prophylaxis to orthopedics.  Code Status: DO NOT RESUSCITATE Family Communication: updated patient, husband and son at bedside. Disposition Plan: Admit to medsurg  Time spent: 70 mins  [facility] MD  [facility] Pager [phone]  If 7PM-7AM, please contact night-coverage [email or web address] Password [facility] 10/13/2013, 6:40 PM

## 2013-10-13 NOTE — ED Provider Notes (Signed)
CSN: [id number]     Arrival date & time 10/13/13  1422 History   First MD Initiated Contact with Patient 10/13/13 1501     Chief Complaint  Patient presents with  . Fall   (Consider location/radiation/quality/duration/timing/severity/associated sxs/prior Treatment) Patient is a 79 y.o. female presenting with fall. The history is provided by the patient, the spouse and a relative. The history is limited by the condition of the patient.  Fall  pt s/p fall at home this morning. Pt w hx advanced dementia. Has hx frequent falls, is not supposed to walk without assistance, but often will.  Got out of bed this morning, husband didn't awaken to help her, she tried to walk on own and fell. No noted loc.  Family states current mental state c/w baseline. Son questions recent increase in generalized weakness/deconditioning. Also notes 10+ lb wt loss in last 6 months. Spouse states pt is eating and drinking per her normal routine. No recent change in meds.  Family also notes pt has been dragging left leg for several months, but states now is complaining of pain in leg upper leg - no prior dx as relates the dragging of the leg. Denies prior cva. Pt is extremely limited historian given degree of dementia - level 5 caveat.     Past Medical History  Diagnosis Date  . Diabetes mellitus   . Dementia   . Thyroid disease   . Hypertension    Past Surgical History  Procedure Laterality Date  . Abdominal hysterectomy  1946    partial   Family History  Problem Relation Age of Onset  . Stroke Mother   . Other Father     accident   History  Substance Use Topics  . Smoking status: Never Smoker   . Smokeless tobacco: Never Used  . Alcohol Use: No   OB History   Grav Para Term Preterm Abortions TAB SAB Ect Mult Living                 Review of Systems  Unable to perform ROS: Dementia  level 5 caveat, pt unresponsive to questioning.    Allergies  Review of patient's allergies indicates no known  allergies.  Home Medications   Current Outpatient Rx  Name  Route  Sig  Dispense  Refill  . divalproex (DEPAKOTE SPRINKLE) 125 MG capsule   Oral   Take 250 mg by mouth 2 (two) times daily.          . feeding supplement (ENSURE COMPLETE) LIQD   Oral   Take 237 mLs by mouth 2 (two) times daily between meals.         Marland Kitchen LORazepam (ATIVAN) 0.5 MG tablet   Oral   Take 0.5 mg by mouth every 12 (twelve) hours.         . traMADol (ULTRAM) 50 MG tablet   Oral   Take 50 mg by mouth every 6 (six) hours as needed for pain.          BP 130/70  Pulse 96  Resp 18  SpO2 100% Physical Exam  Nursing note and vitals reviewed. Constitutional: No distress.  Elderly, frail appearing.   HENT:  Mouth/Throat: Oropharynx is clear and moist.  Bruising to right forehead - yellowish/purple - family states from prior fall, not current fall.   Eyes: Conjunctivae are normal. Pupils are equal, round, and reactive to light. No scleral icterus.  Neck: Normal range of motion. Neck supple. No tracheal deviation present. No  thyromegaly present.  No bruit  Cardiovascular: Normal rate, regular rhythm, normal heart sounds and intact distal pulses.   Pulmonary/Chest: Effort normal and breath sounds normal. No respiratory distress. She exhibits no tenderness.  Abdominal: Soft. Normal appearance. She exhibits no distension. There is no tenderness.  No abd wall bruising, contusion, or tenderness  Genitourinary:  No cva tenderness  Musculoskeletal: She exhibits no edema.  CTLS spine, non tender, aligned, no step off. Good rom bil ext, pain left mid thigh and hip area, no other pain, sts, or bony tenderness w rom and exam bil extremities. Distal pulses palp.   Neurological: She is alert.  Alert, confused, speaks but not in manner whereby is responding to questioning. Says random words, thoughts, difficult to understand. Moves bil extremities purposeful, though limits use/rom of left leg. Motor intact bil.  4+/5.  Skin: Skin is warm and dry. No rash noted. She is not diaphoretic.  Psychiatric:  Alert, content, confused. Mental state c/w baseline per family.     ED Course  Procedures (including critical care time) Labs Review  Results for orders placed during the hospital encounter of 10/13/13  CBC      Result Value Range   WBC 10.9 (*) 4.0 - 10.5 K/uL   RBC 4.38  3.87 - 5.11 MIL/uL   Hemoglobin 11.9 (*) 12.0 - 15.0 g/dL   HCT 11.9  14.7 - 82.9 %   MCV 82.2  78.0 - 100.0 fL   MCH 27.2  26.0 - 34.0 pg   MCHC 33.1  30.0 - 36.0 g/dL   RDW 56.2  13.0 - 86.5 %   Platelets 217  150 - 400 K/uL  COMPREHENSIVE METABOLIC PANEL      Result Value Range   Sodium 132 (*) 135 - 145 mEq/L   Potassium 3.7  3.5 - 5.1 mEq/L   Chloride 98  96 - 112 mEq/L   CO2 21  19 - 32 mEq/L   Glucose, Bld 276 (*) 70 - 99 mg/dL   BUN 32 (*) 6 - 23 mg/dL   Creatinine, Ser 7.84  0.50 - 1.10 mg/dL   Calcium 9.6  8.4 - 69.6 mg/dL   Total Protein 7.1  6.0 - 8.3 g/dL   Albumin 3.4 (*) 3.5 - 5.2 g/dL   AST 17  0 - 37 U/L   ALT 12  0 - 35 U/L   Alkaline Phosphatase 88  39 - 117 U/L   Total Bilirubin 0.4  0.3 - 1.2 mg/dL   GFR calc non Af Amer 43 (*) >90 mL/min   GFR calc Af Amer 50 (*) >90 mL/min  VALPROIC ACID LEVEL      Result Value Range   Valproic Acid Lvl 20.5 (*) 50.0 - 100.0 ug/mL   Dg Chest 1 View  10/13/2013   CLINICAL DATA:  Fall, left hip pain  EXAM: CHEST - 1 VIEW  COMPARISON:  04/05/2013  FINDINGS: Cardiomediastinal silhouette is stable. No acute infiltrate or pleural effusion. No pulmonary edema. No diagnostic pneumothorax.  IMPRESSION: No active disease.  No diagnostic pneumothorax.   Electronically Signed   By: Natasha Mead M.D.   On: 10/13/2013 16:01   Dg Pelvis 1-2 Views  10/13/2013   CLINICAL DATA:  Left hip pain following a fall.  EXAM: PELVIS - 1-2 VIEW  COMPARISON:  Abdomen radiographs dated 10/16/2006.  FINDINGS: Left femoral neck fracture with varus angulation and proximal displacement of the  distal fragment. Diffuse osteopenia. Atheromatous arterial calcifications. Lower lumbar spine  scoliosis.  IMPRESSION: Left femoral neck fracture, as described above.   Electronically Signed   By: Gordan Payment M.D.   On: 10/13/2013 16:03   Dg Femur Left  10/13/2013   CLINICAL DATA:  Left hip pain following a fall.  EXAM: LEFT FEMUR - 2 VIEW  COMPARISON:  Left hip radiographs obtained at the same time.  FINDINGS: Previously described left femoral neck fracture. No additional fractures or dislocation. Diffuse osteopenia.  IMPRESSION: Previously described left femoral neck fracture. Diffuse osteopenia.   Electronically Signed   By: Gordan Payment M.D.   On: 10/13/2013 16:03   Ct Head Wo Contrast  10/13/2013   CLINICAL DATA:  Fall, dementia  EXAM: CT HEAD WITHOUT CONTRAST  TECHNIQUE: Contiguous axial images were obtained from the base of the skull through the vertex without intravenous contrast.  COMPARISON:  04/03/2013  FINDINGS: No skull fracture is noted. Paranasal sinuses and mastoid air cells are unremarkable. No intracranial hemorrhage, mass effect or midline shift. Stable lacunar infarct in left basal ganglia. Stable atrophy and chronic white matter disease. No acute cortical infarction. No mass lesion is noted on this unenhanced scan.  IMPRESSION: No acute intracranial abnormality. Stable atrophy and chronic white matter disease. Again noted small lacunar infarct in left basal ganglia.   Electronically Signed   By: Natasha Mead M.D.   On: 10/13/2013 16:06     EKG Interpretation   None       MDM  Iv ns. Labs. Ecg. Xr. Ct.  Reviewed nursing notes and prior charts for additional history.   xr shows left femoral neck fx.  Morphine iv for pain.  Recheck pt comfortable.  Discussed w Dr Lajoyce Corners, on call for ortho, he will see.  Will admit to med service (hopsitalist service admits hip fxs for G[facility], pcp is Dr Clelia Croft).  [facility] called.   [facility] states temp orders, team 8, med surg.   Recheck  pain controlled. No new c/o. Family updated.       Suzi Roots, MD 10/13/13 [phone]

## 2013-10-14 ENCOUNTER — Encounter (HOSPITAL_COMMUNITY): Payer: Medicare Other | Admitting: Anesthesiology

## 2013-10-14 ENCOUNTER — Encounter (HOSPITAL_COMMUNITY)
Admission: EM | Disposition: A | Discharge status: Skilled Nursing Facility | Payer: Self-pay | Source: Home / Self Care | Attending: Internal Medicine

## 2013-10-14 ENCOUNTER — Inpatient Hospital Stay (HOSPITAL_COMMUNITY): Payer: Medicare Other | Admitting: Anesthesiology

## 2013-10-14 DIAGNOSIS — N390 Urinary tract infection, site not specified: Secondary | ICD-10-CM

## 2013-10-14 HISTORY — PX: HIP ARTHROPLASTY: SHX981

## 2013-10-14 LAB — CBC
HCT: 32.3 % — ABNORMAL LOW (ref 36.0–46.0)
Hemoglobin: 10.6 g/dL — ABNORMAL LOW (ref 12.0–15.0)
MCV: 81.6 fL (ref 78.0–100.0)
Platelets: 167 10*3/uL (ref 150–400)
RBC: 3.96 MIL/uL (ref 3.87–5.11)
WBC: 8.1 10*3/uL (ref 4.0–10.5)

## 2013-10-14 LAB — TSH: TSH: 4.152 u[IU]/mL (ref 0.350–4.500)

## 2013-10-14 LAB — BASIC METABOLIC PANEL
CO2: 21 mEq/L (ref 19–32)
Chloride: 104 mEq/L (ref 96–112)
Creatinine, Ser: 0.87 mg/dL (ref 0.50–1.10)
Glucose, Bld: 172 mg/dL — ABNORMAL HIGH (ref 70–99)
Potassium: 3.6 mEq/L (ref 3.5–5.1)
Sodium: 136 mEq/L (ref 135–145)

## 2013-10-14 LAB — MRSA PCR SCREENING: MRSA by PCR: NEGATIVE

## 2013-10-14 LAB — GLUCOSE, CAPILLARY
Glucose-Capillary: 193 mg/dL — ABNORMAL HIGH (ref 70–99)
Glucose-Capillary: 167 mg/dL — ABNORMAL HIGH (ref 70–99)

## 2013-10-14 SURGERY — HEMIARTHROPLASTY, HIP, DIRECT ANTERIOR APPROACH, FOR FRACTURE
Anesthesia: General | Site: Hip | Laterality: Left

## 2013-10-14 MED ORDER — CEFAZOLIN SODIUM-DEXTROSE 2-3 GM-% IV SOLR: 2.0000 g | Freq: Four times a day (QID) | INTRAVENOUS | Status: DC

## 2013-10-14 MED ORDER — LACTATED RINGERS IV SOLN: INTRAVENOUS | Status: DC

## 2013-10-14 MED ORDER — CEFAZOLIN SODIUM-DEXTROSE 2-3 GM-% IV SOLR
INTRAVENOUS | Status: AC
  Filled 2013-10-14: qty 50

## 2013-10-14 MED ORDER — SUCCINYLCHOLINE CHLORIDE 20 MG/ML IJ SOLN
INTRAMUSCULAR | Status: AC
  Filled 2013-10-14: qty 1

## 2013-10-14 MED ORDER — ACETAMINOPHEN 325 MG PO TABS: 650.0000 mg | ORAL_TABLET | Freq: Four times a day (QID) | ORAL | Status: DC | PRN

## 2013-10-14 MED ORDER — PHENOL 1.4 % MT LIQD: 1.0000 | OROMUCOSAL | Status: DC | PRN

## 2013-10-14 MED ORDER — ONDANSETRON HCL 4 MG/2ML IJ SOLN: 4.0000 mg | Freq: Four times a day (QID) | INTRAMUSCULAR | Status: DC | PRN

## 2013-10-14 MED ORDER — STERILE WATER FOR IRRIGATION IR SOLN
Status: DC | PRN
  Administered 2013-10-14: 1500 mL

## 2013-10-14 MED ORDER — ONDANSETRON HCL 4 MG PO TABS: 4.0000 mg | ORAL_TABLET | Freq: Four times a day (QID) | ORAL | Status: DC | PRN

## 2013-10-14 MED ORDER — LIDOCAINE HCL (CARDIAC) 20 MG/ML IV SOLN
INTRAVENOUS | Status: AC
  Filled 2013-10-14: qty 5

## 2013-10-14 MED ORDER — ACETAMINOPHEN 650 MG RE SUPP: 650.0000 mg | Freq: Four times a day (QID) | RECTAL | Status: DC | PRN

## 2013-10-14 MED ORDER — PROPOFOL 10 MG/ML IV BOLUS
INTRAVENOUS | Status: AC
  Filled 2013-10-14: qty 20

## 2013-10-14 MED ORDER — 0.9 % SODIUM CHLORIDE (POUR BTL) OPTIME
TOPICAL | Status: DC | PRN
  Administered 2013-10-14: 1000 mL

## 2013-10-14 MED ORDER — METOCLOPRAMIDE HCL 10 MG PO TABS: 5.0000 mg | ORAL_TABLET | Freq: Three times a day (TID) | ORAL | Status: DC | PRN

## 2013-10-14 MED ORDER — TRAMADOL HCL 50 MG PO TABS: 50.0000 mg | ORAL_TABLET | Freq: Four times a day (QID) | ORAL | Status: DC | PRN

## 2013-10-14 MED ORDER — SODIUM CHLORIDE 0.9 % IV SOLN
INTRAVENOUS | Status: DC
  Administered 2013-10-14 – 2013-10-15 (×2): via INTRAVENOUS

## 2013-10-14 MED ORDER — PROPOFOL 10 MG/ML IV BOLUS
INTRAVENOUS | Status: DC | PRN
  Administered 2013-10-14: 140 mg via INTRAVENOUS

## 2013-10-14 MED ORDER — HYDROMORPHONE HCL PF 1 MG/ML IJ SOLN
0.2500 mg | INTRAMUSCULAR | Status: DC | PRN
  Administered 2013-10-14: 0.5 mg via INTRAVENOUS

## 2013-10-14 MED ORDER — ACETAMINOPHEN 500 MG PO TABS
500.0000 mg | ORAL_TABLET | Freq: Three times a day (TID) | ORAL | Status: DC
  Administered 2013-10-14 – 2013-10-16 (×6): 500 mg via ORAL
  Filled 2013-10-14 (×9): qty 1

## 2013-10-14 MED ORDER — HYDROCODONE-ACETAMINOPHEN 5-325 MG PO TABS: 1.0000 | ORAL_TABLET | Freq: Four times a day (QID) | ORAL | Status: DC | PRN

## 2013-10-14 MED ORDER — PHENYLEPHRINE HCL 10 MG/ML IJ SOLN
INTRAMUSCULAR | Status: DC | PRN
  Administered 2013-10-14 (×2): 80 ug via INTRAVENOUS
  Administered 2013-10-14: 40 ug via INTRAVENOUS

## 2013-10-14 MED ORDER — ASPIRIN EC 325 MG PO TBEC: 325.0000 mg | DELAYED_RELEASE_TABLET | Freq: Every day | ORAL | Status: DC

## 2013-10-14 MED ORDER — FENTANYL CITRATE 0.05 MG/ML IJ SOLN
INTRAMUSCULAR | Status: AC
  Filled 2013-10-14: qty 2

## 2013-10-14 MED ORDER — CEFAZOLIN SODIUM-DEXTROSE 2-3 GM-% IV SOLR
2.0000 g | INTRAVENOUS | Status: AC
  Administered 2013-10-14: 2 g via INTRAVENOUS
  Filled 2013-10-14: qty 50

## 2013-10-14 MED ORDER — PHENYLEPHRINE 40 MCG/ML (10ML) SYRINGE FOR IV PUSH (FOR BLOOD PRESSURE SUPPORT)
PREFILLED_SYRINGE | INTRAVENOUS | Status: AC
  Filled 2013-10-14: qty 10

## 2013-10-14 MED ORDER — KETOROLAC TROMETHAMINE 30 MG/ML IJ SOLN: 30.0000 mg | Freq: Four times a day (QID) | INTRAMUSCULAR | Status: DC | PRN

## 2013-10-14 MED ORDER — LACTATED RINGERS IV SOLN
INTRAVENOUS | Status: DC | PRN
  Administered 2013-10-14: 08:00:00 via INTRAVENOUS

## 2013-10-14 MED ORDER — MENTHOL 3 MG MT LOZG: 1.0000 | LOZENGE | OROMUCOSAL | Status: DC | PRN

## 2013-10-14 MED ORDER — HYDROMORPHONE HCL PF 1 MG/ML IJ SOLN
INTRAMUSCULAR | Status: AC
  Filled 2013-10-14: qty 1

## 2013-10-14 MED ORDER — FENTANYL CITRATE 0.05 MG/ML IJ SOLN
INTRAMUSCULAR | Status: DC | PRN
  Administered 2013-10-14: 50 ug via INTRAVENOUS
  Administered 2013-10-14 (×2): 25 ug via INTRAVENOUS

## 2013-10-14 MED ORDER — SUCCINYLCHOLINE CHLORIDE 20 MG/ML IJ SOLN
INTRAMUSCULAR | Status: DC | PRN
  Administered 2013-10-14: 100 mg via INTRAVENOUS

## 2013-10-14 MED ORDER — LIDOCAINE HCL (CARDIAC) 20 MG/ML IV SOLN
INTRAVENOUS | Status: DC | PRN
  Administered 2013-10-14: 50 mg via INTRAVENOUS

## 2013-10-14 MED ORDER — LORAZEPAM 0.5 MG PO TABS: 0.5000 mg | ORAL_TABLET | Freq: Two times a day (BID) | ORAL | Status: DC | PRN

## 2013-10-14 MED ORDER — METOCLOPRAMIDE HCL 5 MG/ML IJ SOLN: 5.0000 mg | Freq: Three times a day (TID) | INTRAMUSCULAR | Status: DC | PRN

## 2013-10-14 MED ORDER — ASPIRIN EC 325 MG PO TBEC
325.0000 mg | DELAYED_RELEASE_TABLET | Freq: Every day | ORAL | Status: DC
  Administered 2013-10-15 – 2013-10-16 (×2): 325 mg via ORAL
  Filled 2013-10-14 (×3): qty 1

## 2013-10-14 MED ORDER — HYDROMORPHONE HCL PF 1 MG/ML IJ SOLN: 0.2500 mg | INTRAMUSCULAR | Status: DC | PRN

## 2013-10-14 SURGICAL SUPPLY — 45 items
BAG SPEC THK2 15X12 ZIP CLS (MISCELLANEOUS) ×1
BAG ZIPLOCK 12X15 (MISCELLANEOUS) ×2 IMPLANT
BLADE SAW SAG 73X25 THK (BLADE) ×1
BLADE SAW SGTL 73X25 THK (BLADE) ×1 IMPLANT
BRUSH FEMORAL CANAL (MISCELLANEOUS) ×2 IMPLANT
CAP PRESSFIT BIPLR HP CP STDHD ×2 IMPLANT
DRAPE INCISE IOBAN 66X45 STRL (DRAPES) ×2 IMPLANT
DRAPE ORTHO SPLIT 77X108 STRL (DRAPES) ×2
DRAPE POUCH INSTRU U-SHP 10X18 (DRAPES) ×2 IMPLANT
DRAPE SURG ORHT 6 SPLT 77X108 (DRAPES) ×1 IMPLANT
DRAPE U-SHAPE 47X51 STRL (DRAPES) ×2 IMPLANT
DRSG EMULSION OIL 3X16 NADH (GAUZE/BANDAGES/DRESSINGS) ×2 IMPLANT
DRSG MEPILEX BORDER 4X8 (GAUZE/BANDAGES/DRESSINGS) ×1 IMPLANT
DURAPREP 26ML APPLICATOR (WOUND CARE) ×2 IMPLANT
ELECT REM PT RETURN 9FT ADLT (ELECTROSURGICAL) ×2
ELECTRODE REM PT RTRN 9FT ADLT (ELECTROSURGICAL) ×1 IMPLANT
EVACUATOR 1/8 PVC DRAIN (DRAIN) ×2 IMPLANT
FACESHIELD LNG OPTICON STERILE (SAFETY) ×6 IMPLANT
GLOVE BIOGEL PI IND STRL 8.5 (GLOVE) ×1 IMPLANT
GLOVE BIOGEL PI IND STRL 9 (GLOVE) ×1 IMPLANT
GLOVE BIOGEL PI INDICATOR 8.5 (GLOVE) ×1
GLOVE BIOGEL PI INDICATOR 9 (GLOVE) ×1
GLOVE SURG ORTHO 9.0 STRL STRW (GLOVE) ×2 IMPLANT
HANDPIECE INTERPULSE COAX TIP (DISPOSABLE) ×2
IMMOBILIZER KNEE 20 (SOFTGOODS)
IMMOBILIZER KNEE 20 THIGH 36 (SOFTGOODS) IMPLANT
KIT BASIN OR (CUSTOM PROCEDURE TRAY) ×2 IMPLANT
MANIFOLD NEPTUNE II (INSTRUMENTS) ×2 IMPLANT
PACK TOTAL JOINT (CUSTOM PROCEDURE TRAY) ×2 IMPLANT
PAD ABD 8X10 STRL (GAUZE/BANDAGES/DRESSINGS) ×2 IMPLANT
PASSER SUT SWANSON 36MM LOOP (INSTRUMENTS) ×2 IMPLANT
POSITIONER SURGICAL ARM (MISCELLANEOUS) ×2 IMPLANT
SET HNDPC FAN SPRY TIP SCT (DISPOSABLE) ×1 IMPLANT
SPONGE GAUZE 4X4 12PLY (GAUZE/BANDAGES/DRESSINGS) ×4 IMPLANT
STAPLER SKIN PROX WIDE 3.9 (STAPLE) ×2 IMPLANT
STAPLER VISISTAT 35W (STAPLE) ×2 IMPLANT
SUT ETHIBOND NAB CT1 #1 30IN (SUTURE) ×2 IMPLANT
SUT VIC AB 0 CT1 27 (SUTURE) ×4
SUT VIC AB 0 CT1 27XBRD ANTBC (SUTURE) ×2 IMPLANT
SUT VIC AB 1 CTX 36 (SUTURE) ×4
SUT VIC AB 1 CTX36XBRD ANBCTR (SUTURE) ×2 IMPLANT
SUT VIC AB 2-0 CT1 27 (SUTURE) ×4
SUT VIC AB 2-0 CT1 TAPERPNT 27 (SUTURE) ×2 IMPLANT
TAPE CLOTH SURG 4X10 WHT LF (GAUZE/BANDAGES/DRESSINGS) ×1 IMPLANT
TOWER CARTRIDGE SMART MIX (DISPOSABLE) ×2 IMPLANT

## 2013-10-14 NOTE — Plan of Care (Signed)
Problem: Phase I Progression Outcomes Goal: Pain controlled with appropriate interventions Outcome: Completed/Met Date Met:  10/14/13 Pt with confusion and dementia at this time. No family in pt's room.

## 2013-10-14 NOTE — Progress Notes (Signed)
PT Cancellation Note  Patient Details Name: Anne [name] MRN: [id number] DOB: [birth year]   Cancelled Treatment:    Reason Eval/Treat Not Completed: Other (comment) (Pt in OR today.  Will discontinue this order.  )  Please reorder PT with weight bearing status after OR. Thank you.   Yetta Barre, PT 10/14/2013, 9:03 AM

## 2013-10-14 NOTE — Transfer of Care (Signed)
Immediate Anesthesia Transfer of Care Note  Patient: Anne [name]  Procedure(s) Performed: Procedure(s): ARTHROPLASTY BIPOLAR HIP (Left)  Patient Location: PACU  Anesthesia Type:General  Level of Consciousness: awake, alert  and oriented  Airway & Oxygen Therapy: Patient Spontanous Breathing and Patient connected to face mask oxygen  Post-op Assessment: Report given to PACU RN and Post -op Vital signs reviewed and stable  Post vital signs: Reviewed and stable  Complications: No apparent anesthesia complications

## 2013-10-14 NOTE — Progress Notes (Signed)
[facility] PROGRESS NOTE  VENOLA CASTELLO ZOX:[id number] DOB: October 16, 1916 DOA: 10/13/2013 PCP: Kari Baars, MD  Assessment/Plan: #1. Left displaced femoral neck fracture Secondary to mechanical fall. Patient is status post left hip bipolar arthroplasty per Dr. Lajoyce Corners. PT/OT. Pain management. Per orthopedics.  #2 dehydration IV fluids.  #3 heart murmur 2-D echo pending.  #4 urinary tract infection Urine cultures are pending. Continue IV Rocephin.  #5 hypertension Not on any antihypertensive medications.  #6 well controlled diabetes mellitus Hemoglobin A1c 6.7. CBGs have ranged from 147-193. Sliding scale insulin.  #7 Alzheimer's dementia Continue Depakote.  #8 history of thyroid disease TSH within normal limits at 4.152. Of thyroid medications. Outpatient followup.  #9 QTC prolongation Questionable etiology. Repeat EKG pending.  #10 prophylaxis PPI for GI prophylaxis. Will defer DVT prophylaxis to orthopedics.  Code Status: Full Family Communication: Updated husband at bedside. Disposition Plan: Probable SNF when medically stable   Consultants:  Orthopedics: Dr. Lajoyce Corners 10/13/2013  Procedures:  Chest x-ray 10/13/2013  X-ray of the left femur 10/13/2013  X-ray of the pelvis 10/13/2013  Left hip bipolar arthroplasty 10/14/2013 per Dr. Lajoyce Corners  Antibiotics:  IV Rocephin 10/13/13  HPI/Subjective: Patient is sleeping, post op.   Objective: Filed Vitals:   10/14/13 1330  BP: 135/76  Pulse: 91  Temp: 98.2 F (36.8 C)  Resp: 16    Intake/Output Summary (Last 24 hours) at 10/14/13 1525 Last data filed at 10/14/13 1000  Gross per 24 hour  Intake   1050 ml  Output    500 ml  Net    550 ml   Filed Weights   10/13/13 1827  Weight: 43.6 kg (96 lb 1.9 oz)    Exam:   General:  Sleeping.  Cardiovascular: RRR  Respiratory: CTAB  Abdomen: Soft/NT/ND/+BS  Musculoskeletal: No c/c/e   Data Reviewed: Basic Metabolic Panel:  Recent Labs Lab  10/13/13 1631 10/14/13 0543  NA 132* 136  K 3.7 3.6  CL 98 104  CO2 21 21  GLUCOSE 276* 172*  BUN 32* 27*  CREATININE 1.07 0.87  CALCIUM 9.6 8.8   Liver Function Tests:  Recent Labs Lab 10/13/13 1631  AST 17  ALT 12  ALKPHOS 88  BILITOT 0.4  PROT 7.1  ALBUMIN 3.4*   No results found for this basename: LIPASE, AMYLASE,  in the last 168 hours No results found for this basename: AMMONIA,  in the last 168 hours CBC:  Recent Labs Lab 10/13/13 1631 10/14/13 0543  WBC 10.9* 8.1  HGB 11.9* 10.6*  HCT 36.0 32.3*  MCV 82.2 81.6  PLT 217 167   Cardiac Enzymes: No results found for this basename: CKTOTAL, CKMB, CKMBINDEX, TROPONINI,  in the last 168 hours BNP (last 3 results) No results found for this basename: PROBNP,  in the last 8760 hours CBG:  Recent Labs Lab 10/14/13 0732 10/14/13 1240  GLUCAP 193* 147*    Recent Results (from the past 240 hour(s))  MRSA PCR SCREENING     Status: None   Collection Time    10/13/13 11:47 PM      Result Value Range Status   MRSA by PCR NEGATIVE  NEGATIVE Final   Comment:            The GeneXpert MRSA Assay (FDA     approved for NASAL specimens     only), is one component of a     comprehensive MRSA colonization     surveillance program. It is not     intended  to diagnose MRSA     infection nor to guide or     monitor treatment for     MRSA infections.     Studies: Dg Chest 1 View  10/13/2013   CLINICAL DATA:  Fall, left hip pain  EXAM: CHEST - 1 VIEW  COMPARISON:  04/05/2013  FINDINGS: Cardiomediastinal silhouette is stable. No acute infiltrate or pleural effusion. No pulmonary edema. No diagnostic pneumothorax.  IMPRESSION: No active disease.  No diagnostic pneumothorax.   Electronically Signed   By: Natasha Mead M.D.   On: 10/13/2013 16:01   Dg Pelvis 1-2 Views  10/13/2013   CLINICAL DATA:  Left hip pain following a fall.  EXAM: PELVIS - 1-2 VIEW  COMPARISON:  Abdomen radiographs dated 10/16/2006.  FINDINGS: Left  femoral neck fracture with varus angulation and proximal displacement of the distal fragment. Diffuse osteopenia. Atheromatous arterial calcifications. Lower lumbar spine scoliosis.  IMPRESSION: Left femoral neck fracture, as described above.   Electronically Signed   By: Gordan Payment M.D.   On: 10/13/2013 16:03   Dg Femur Left  10/13/2013   CLINICAL DATA:  Left hip pain following a fall.  EXAM: LEFT FEMUR - 2 VIEW  COMPARISON:  Left hip radiographs obtained at the same time.  FINDINGS: Previously described left femoral neck fracture. No additional fractures or dislocation. Diffuse osteopenia.  IMPRESSION: Previously described left femoral neck fracture. Diffuse osteopenia.   Electronically Signed   By: Gordan Payment M.D.   On: 10/13/2013 16:03   Ct Head Wo Contrast  10/13/2013   CLINICAL DATA:  Fall, dementia  EXAM: CT HEAD WITHOUT CONTRAST  TECHNIQUE: Contiguous axial images were obtained from the base of the skull through the vertex without intravenous contrast.  COMPARISON:  04/03/2013  FINDINGS: No skull fracture is noted. Paranasal sinuses and mastoid air cells are unremarkable. No intracranial hemorrhage, mass effect or midline shift. Stable lacunar infarct in left basal ganglia. Stable atrophy and chronic white matter disease. No acute cortical infarction. No mass lesion is noted on this unenhanced scan.  IMPRESSION: No acute intracranial abnormality. Stable atrophy and chronic white matter disease. Again noted small lacunar infarct in left basal ganglia.   Electronically Signed   By: Natasha Mead M.D.   On: 10/13/2013 16:06    Scheduled Meds: . [START ON 10/15/2013] aspirin EC  325 mg Oral Q breakfast  . cefTRIAXone (ROCEPHIN)  IV  1 g Intravenous Q24H  . docusate sodium  100 mg Oral BID  . feeding supplement (ENSURE COMPLETE)  237 mL Oral BID BM  . HYDROmorphone      . insulin aspart  0-9 Units Subcutaneous TID WC  . LORazepam  0.5 mg Oral Q12H  . pantoprazole  40 mg Oral Q0600  . valproate  sodium  250 mg Intravenous Q12H   Continuous Infusions: . sodium chloride 75 mL/hr at 10/13/13 1900  . sodium chloride 20 mL/hr at 10/14/13 4540    Principal Problem:   Left displaced femoral neck fracture Active Problems:   Weakness   Urinary tract infection   Alzheimer's dementia   Unspecified constipation   Thyroid disease   HTN (hypertension)   Dehydration   Diabetes mellitus   Murmur, heart    Time spent: 69 MINS    [facility] MD [facility] Pager [phone]. If 7PM-7AM, please contact night-coverage at [email or web address], password [facility] 10/14/2013, 3:25 PM  LOS: 1 day

## 2013-10-14 NOTE — Op Note (Signed)
OPERATIVE REPORT  DATE OF SURGERY: 10/14/2013  PATIENT:  Anne [name],  79 y.o. female  PRE-OPERATIVE DIAGNOSIS:  left hip fracture  POST-OPERATIVE DIAGNOSIS:  left hip fracture  PROCEDURE:  Procedure(s): ARTHROPLASTY BIPOLAR HIP Zimmer components size 13 stem size 44 mm bipolar ball with 26 mm internal ball.  SURGEON:  Surgeon(s): Nadara Mustard, MD  ANESTHESIA:   general  EBL:  min ML  SPECIMEN:  No Specimen  TOURNIQUET:  * No tourniquets in log *  PROCEDURE DETAILS: Patient is a 79 year old woman  Level fall sustaining a left femoral neck fracture. Patient's family wished to proceed with surgical intervention risks and benefits were discussed including infection neurovascular injury DVT dislocation mortality risks of the surgery and the hip fracture. Patient's family state they understand and wish to proceed at this time. Description of procedure patient was brought to the operating room and underwent a general anesthetic. After adequate levels and anesthesia obtained patient was placed in the right lateral decubitus position with the left side up and the left lower extremity was prepped using DuraPrep draped into a sterile field Ioban was used to cover all exposed skin. Posterior lateral incision was made this was carried down through the tensor fascia lata this was split. The piriformis short external rotators and capsule were incised off the femoral neck and retracted with Ethibond suture. The neck cut was made 1 cm proximal to the calcar. The head was delivered the head measured 44 mm and a 44 mm trial had good suction fit. The stem was then sequentially broached to a size 13 the wound was irrigated normal saline all debris was removed. The 13 stem bipolar 44 mm head was inserted this was placed through full range of motion the hip was stable. The puriform a short external rotators and capsule were repaired using Ethibond. The tensor fascia lata was closed using #1 Vicryl. The  subcutaneous is closed using 0 Vicryl the skin was closed using staples. A Mepilex dressing was applied patient was extubated taken to the PACU in stable condition.  PLAN OF CARE: Admit to inpatient   PATIENT DISPOSITION:  PACU - hemodynamically stable.   Nadara Mustard, MD 10/14/2013 9:21 AM

## 2013-10-14 NOTE — Anesthesia Postprocedure Evaluation (Signed)
  Anesthesia Post-op Note  Patient: Anne [name]  Procedure(s) Performed: Procedure(s) (LRB): ARTHROPLASTY BIPOLAR HIP (Left)  Patient Location: PACU  Anesthesia Type: General  Level of Consciousness: awake and alert   Airway and Oxygen Therapy: Patient Spontanous Breathing  Post-op Pain: mild  Post-op Assessment: Post-op Vital signs reviewed, Patient's Cardiovascular Status Stable, Respiratory Function Stable, Patent Airway and No signs of Nausea or vomiting  Last Vitals:  Filed Vitals:   10/14/13 1032  BP: 128/77  Pulse: 97  Temp: 36.6 C  Resp: 16    Post-op Vital Signs: stable   Complications: No apparent anesthesia complications

## 2013-10-14 NOTE — Anesthesia Preprocedure Evaluation (Signed)
Anesthesia Evaluation  Patient identified by MRN, date of birth, ID band Patient awake    Reviewed: Allergy & Precautions, H&P , NPO status , Patient's Chart, lab work & pertinent test results  Airway Mallampati: II TM Distance: >3 FB Neck ROM: full    Dental no notable dental hx.    Pulmonary neg pulmonary ROS,  breath sounds clear to auscultation  Pulmonary exam normal       Cardiovascular Exercise Tolerance: Good hypertension, negative cardio ROS  Rhythm:regular Rate:Normal     Neuro/Psych dementia negative psych ROS   GI/Hepatic negative GI ROS, Neg liver ROS,   Endo/Other  diabetes, Well Controlled, Type 2DM no meds  Renal/GU negative Renal ROS  negative genitourinary   Musculoskeletal   Abdominal   Peds  Hematology negative hematology ROS (+)   Anesthesia Other Findings   Reproductive/Obstetrics negative OB ROS                           Anesthesia Physical Anesthesia Plan  ASA: III  Anesthesia Plan: General   Post-op Pain Management:    Induction: Intravenous  Airway Management Planned: Oral ETT  Additional Equipment:   Intra-op Plan:   Post-operative Plan: Extubation in OR  Informed Consent: I have reviewed the patients History and Physical, chart, labs and discussed the procedure including the risks, benefits and alternatives for the proposed anesthesia with the patient or authorized representative who has indicated his/her understanding and acceptance.   Dental Advisory Given  Plan Discussed with: CRNA and Surgeon  Anesthesia Plan Comments:         Anesthesia Quick Evaluation

## 2013-10-14 NOTE — Interval H&P Note (Signed)
History and Physical Interval Note:  10/14/2013 5:58 AM  Anne [name]  has presented today for surgery, with the diagnosis of left hip fracture  The various methods of treatment have been discussed with the patient and family. After consideration of risks, benefits and other options for treatment, the patient has consented to  Procedure(s): ARTHROPLASTY BIPOLAR HIP (Left) as a surgical intervention .  The patient's history has been reviewed, patient examined, no change in status, stable for surgery.  I have reviewed the patient's chart and labs.  Questions were answered to the patient's satisfaction.     DUDA,MARCUS V

## 2013-10-14 NOTE — H&P (View-Only) (Signed)
Reason for Consult: Left femoral neck fracture Referring Physician: Thompson  Anne [name] is an 79 y.o. female.  HPI: Patient is a 79-year-old woman with multiple medical problems including diabetes hypertension Alzheimer's thyroid disease chronic cardiac murmur who presents with acute left femoral neck fracture. Patient was found down on the floor early in the morning.  Past Medical History  Diagnosis Date  . Diabetes mellitus   . Dementia   . Thyroid disease   . Hypertension   . Murmur, heart 10/13/2013    Past Surgical History  Procedure Laterality Date  . Abdominal hysterectomy  1946    partial    Family History  Problem Relation Age of Onset  . Stroke Mother   . Other Father     accident    Social History:  reports that she has never smoked. She has never used smokeless tobacco. She reports that she does not drink alcohol or use illicit drugs.  Allergies: No Known Allergies  Medications: I have reviewed the patient's current medications.  Results for orders placed during the hospital encounter of 10/13/13 (from the past 48 hour(s))  CBC     Status: Abnormal   Collection Time    10/13/13  4:31 PM      Result Value Range   WBC 10.9 (*) 4.0 - 10.5 K/uL   RBC 4.38  3.87 - 5.11 MIL/uL   Hemoglobin 11.9 (*) 12.0 - 15.0 g/dL   HCT 36.0  36.0 - 46.0 %   MCV 82.2  78.0 - 100.0 fL   MCH 27.2  26.0 - 34.0 pg   MCHC 33.1  30.0 - 36.0 g/dL   RDW 14.5  11.5 - 15.5 %   Platelets 217  150 - 400 K/uL  COMPREHENSIVE METABOLIC PANEL     Status: Abnormal   Collection Time    10/13/13  4:31 PM      Result Value Range   Sodium 132 (*) 135 - 145 mEq/L   Potassium 3.7  3.5 - 5.1 mEq/L   Chloride 98  96 - 112 mEq/L   CO2 21  19 - 32 mEq/L   Glucose, Bld 276 (*) 70 - 99 mg/dL   BUN 32 (*) 6 - 23 mg/dL   Creatinine, Ser 1.07  0.50 - 1.10 mg/dL   Calcium 9.6  8.4 - 10.5 mg/dL   Total Protein 7.1  6.0 - 8.3 g/dL   Albumin 3.4 (*) 3.5 - 5.2 g/dL   AST 17  0 - 37 U/L   ALT 12   0 - 35 U/L   Alkaline Phosphatase 88  39 - 117 U/L   Total Bilirubin 0.4  0.3 - 1.2 mg/dL   GFR calc non Af Amer 43 (*) >90 mL/min   GFR calc Af Amer 50 (*) >90 mL/min   Comment: (NOTE)     The eGFR has been calculated using the CKD EPI equation.     This calculation has not been validated in all clinical situations.     eGFR's persistently <90 mL/min signify possible Chronic Kidney     Disease.  VALPROIC ACID LEVEL     Status: Abnormal   Collection Time    10/13/13  4:31 PM      Result Value Range   Valproic Acid Lvl 20.5 (*) 50.0 - 100.0 ug/mL   Comment: Performed at [facility]  URINALYSIS, ROUTINE W REFLEX MICROSCOPIC     Status: Abnormal   Collection Time    10/13/13    5:05 PM      Result Value Range   Color, Urine YELLOW  YELLOW   APPearance CLEAR  CLEAR   Specific Gravity, Urine 1.022  1.005 - 1.030   pH 6.5  5.0 - 8.0   Glucose, UA 500 (*) NEGATIVE mg/dL   Hgb urine dipstick MODERATE (*) NEGATIVE   Bilirubin Urine NEGATIVE  NEGATIVE   Ketones, ur NEGATIVE  NEGATIVE mg/dL   Protein, ur 30 (*) NEGATIVE mg/dL   Urobilinogen, UA 0.2  0.0 - 1.0 mg/dL   Nitrite POSITIVE (*) NEGATIVE   Leukocytes, UA NEGATIVE  NEGATIVE  URINE MICROSCOPIC-ADD ON     Status: None   Collection Time    10/13/13  5:05 PM      Result Value Range   Squamous Epithelial / LPF RARE  RARE   WBC, UA 0-2  <3 WBC/hpf   RBC / HPF 7-10  <3 RBC/hpf   Bacteria, UA RARE  RARE    Dg Chest 1 View  10/13/2013   CLINICAL DATA:  Fall, left hip pain  EXAM: CHEST - 1 VIEW  COMPARISON:  04/05/2013  FINDINGS: Cardiomediastinal silhouette is stable. No acute infiltrate or pleural effusion. No pulmonary edema. No diagnostic pneumothorax.  IMPRESSION: No active disease.  No diagnostic pneumothorax.   Electronically Signed   By: Liviu  Pop M.D.   On: 10/13/2013 16:01   Dg Pelvis 1-2 Views  10/13/2013   CLINICAL DATA:  Left hip pain following a fall.  EXAM: PELVIS - 1-2 VIEW  COMPARISON:  Abdomen radiographs  dated 10/16/2006.  FINDINGS: Left femoral neck fracture with varus angulation and proximal displacement of the distal fragment. Diffuse osteopenia. Atheromatous arterial calcifications. Lower lumbar spine scoliosis.  IMPRESSION: Left femoral neck fracture, as described above.   Electronically Signed   By: Steve  Reid M.D.   On: 10/13/2013 16:03   Dg Femur Left  10/13/2013   CLINICAL DATA:  Left hip pain following a fall.  EXAM: LEFT FEMUR - 2 VIEW  COMPARISON:  Left hip radiographs obtained at the same time.  FINDINGS: Previously described left femoral neck fracture. No additional fractures or dislocation. Diffuse osteopenia.  IMPRESSION: Previously described left femoral neck fracture. Diffuse osteopenia.   Electronically Signed   By: Steve  Reid M.D.   On: 10/13/2013 16:03   Ct Head Wo Contrast  10/13/2013   CLINICAL DATA:  Fall, dementia  EXAM: CT HEAD WITHOUT CONTRAST  TECHNIQUE: Contiguous axial images were obtained from the base of the skull through the vertex without intravenous contrast.  COMPARISON:  04/03/2013  FINDINGS: No skull fracture is noted. Paranasal sinuses and mastoid air cells are unremarkable. No intracranial hemorrhage, mass effect or midline shift. Stable lacunar infarct in left basal ganglia. Stable atrophy and chronic white matter disease. No acute cortical infarction. No mass lesion is noted on this unenhanced scan.  IMPRESSION: No acute intracranial abnormality. Stable atrophy and chronic white matter disease. Again noted small lacunar infarct in left basal ganglia.   Electronically Signed   By: Liviu  Pop M.D.   On: 10/13/2013 16:06    Review of Systems  All other systems reviewed and are negative.   Blood pressure 179/96, pulse 94, temperature 97.8 F (36.6 C), temperature source Oral, resp. rate 16, height 5' 3" (1.6 m), weight 43.6 kg (96 lb 1.9 oz), SpO2 96.00%. Physical Exam On examination patient is oriented she does have pain with internal and external rotation of  the left hip. Radiograph shows an impacted displaced   left femoral neck fracture.  Assessment/Plan: Assessment: Impacted displaced left femoral neck fracture.  Plan: Treatment options were discussed with the patient's son. Nonoperative versus operative intervention were discussed. Discussed that with operative intervention this would significantly decrease her pain. Feel that with either operative or nonoperative treatment patient would most likely not walk again. Discussed that without operative intervention her mortality risk was most likely higher. But also with her multiple medical problems patient was at increased risk of mortality with surgical intervention. Discussed that there was not a clear option for operative versus nonoperative intervention. Patient's son states he understands and feels that the option of operative intervention to potentially provider increased pain relief was the best option. We will plan for surgical intervention tomorrow morning. Risks including infection neurovascular injury dislocation DVT pulmonary embolus mortality were discussed patient's son states he understands and wished to proceed at this time.   Chenille Toor V 10/13/2013, 7:27 PM      

## 2013-10-15 DIAGNOSIS — E43 Unspecified severe protein-calorie malnutrition: Secondary | ICD-10-CM | POA: Insufficient documentation

## 2013-10-15 DIAGNOSIS — I319 Disease of pericardium, unspecified: Secondary | ICD-10-CM

## 2013-10-15 LAB — CBC
HCT: 30.2 % — ABNORMAL LOW (ref 36.0–46.0)
Hemoglobin: 9.9 g/dL — ABNORMAL LOW (ref 12.0–15.0)
MCH: 26.9 pg (ref 26.0–34.0)
MCHC: 32.8 g/dL (ref 30.0–36.0)
MCV: 82.1 fL (ref 78.0–100.0)
WBC: 6.7 10*3/uL (ref 4.0–10.5)

## 2013-10-15 LAB — BASIC METABOLIC PANEL
BUN: 20 mg/dL (ref 6–23)
Calcium: 8.7 mg/dL (ref 8.4–10.5)
Creatinine, Ser: 0.87 mg/dL (ref 0.50–1.10)
GFR calc non Af Amer: 55 mL/min — ABNORMAL LOW (ref >90)
Glucose, Bld: 224 mg/dL — ABNORMAL HIGH (ref 70–99)

## 2013-10-15 LAB — GLUCOSE, CAPILLARY
Glucose-Capillary: 193 mg/dL — ABNORMAL HIGH (ref 70–99)
Glucose-Capillary: 192 mg/dL — ABNORMAL HIGH (ref 70–99)
Glucose-Capillary: 213 mg/dL — ABNORMAL HIGH (ref 70–99)

## 2013-10-15 MED ORDER — LIP MEDEX EX OINT
TOPICAL_OINTMENT | CUTANEOUS | Status: AC
  Filled 2013-10-15: qty 7

## 2013-10-15 MED ORDER — BOOST PLUS PO LIQD
237.0000 mL | Freq: Three times a day (TID) | ORAL | Status: DC
  Administered 2013-10-15 – 2013-10-16 (×4): 237 mL via ORAL
  Filled 2013-10-15 (×4): qty 237

## 2013-10-15 NOTE — Progress Notes (Signed)
10/15/13 1200  PT Visit Information  Last PT Received On 10/15/13  Assistance Needed +1  History of Present Illness s/p Left hip hemiarthroplasty  PT Time Calculation  PT Start Time 1135  PT Stop Time 1154  PT Time Calculation (min) 19 min  Subjective Data  Patient Stated Goal home after rehab per family  Precautions  Precautions Posterior Hip;Fall  Cognition  Arousal/Alertness Awake/alert  Behavior During Therapy Restless (mildly)  Bed Mobility  Bed Mobility Sit to Supine  Sit to Supine 1: +2 Total assist  Sit to Supine: Patient Percentage 0%  Details for Bed Mobility Assistance +2 for trunk, LEs and to maintain THP  Transfers  Transfers Sit to Stand;Stand to Sit;Stand Pivot Transfers  Sit to Stand 1: +2 Total assist  Sit to Stand: Patient Percentage 30%  Stand to Sit 1: +2 Total assist  Stand to Sit: Patient Percentage 20%  Stand Pivot Transfers 1: +2 Total assist  Stand Pivot Transfers: Patient Percentage 20%  Details for Transfer Assistance cues for hand  placement,  safety,  THP; +2 for wt shift, pivot, lines, THP  Ambulation/Gait  Ambulation/Gait Assistance Not tested (comment)  General Exercises - Lower Extremity  Ankle Circles/Pumps AAROM;Both;10 reps  PT - End of Session  Equipment Utilized During Treatment Gait belt  Activity Tolerance Patient tolerated treatment well  Patient left in bed;with call bell/phone within reach;with bed alarm set  Nurse Communication Mobility status  PT - Assessment/Plan  PT Plan Current plan remains appropriate  PT Frequency Min 3X/week  Follow Up Recommendations SNF  PT Goal Progression  Progress towards PT goals Progressing toward goals  Acute Rehab PT Goals  Time For Goal Achievement 10/22/13  Potential to Achieve Goals Good  PT Evaluation  $Initial PT Evaluation Tier I 1 Procedure  PT Treatments  $Therapeutic Activity 8-22 mins

## 2013-10-15 NOTE — Progress Notes (Signed)
  Echocardiogram 2D Echocardiogram has been performed.  Anne [name] 10/15/2013, 9:39 AM

## 2013-10-15 NOTE — Care Management Note (Addendum)
    Page 1 of 1   10/16/2013     10:54:54 AM   CARE MANAGEMENT NOTE 10/16/2013  Patient:  Anne [name], Anne [name]   Account Number:  [id number]  Date Initiated:  10/15/2013  Documentation initiated by:  Lorenda Ishihara  Subjective/Objective Assessment:   79 yo female admitted s/p fall with hip surgery. PTA lived at home with spouse.     Action/Plan:   not LTACH appropriate, SNF at d/c   Anticipated DC Date:  10/17/2013   Anticipated DC Plan:  SKILLED NURSING FACILITY  In-house referral  Clinical Social Worker      DC Planning Services  CM consult      Choice offered to / List presented to:             Status of service:  Completed, signed off Medicare Important Message given?   (If response is "NO", the following Medicare IM given date fields will be blank) Date Medicare IM given:   Date Additional Medicare IM given:    Discharge Disposition:  SKILLED NURSING FACILITY  Per UR Regulation:  Reviewed for med. necessity/level of care/duration of stay  If discussed at Long Length of Stay Meetings, dates discussed:    Comments:

## 2013-10-15 NOTE — Progress Notes (Signed)
[facility] PROGRESS NOTE  Anne [name] ZOX:[id number] DOB: 19-Nov-1915 DOA: 10/13/2013 PCP: Kari Baars, MD  Assessment/Plan: #1. Left displaced femoral neck fracture Secondary to mechanical fall. Patient is status post left hip bipolar arthroplasty per Dr. Lajoyce Corners. PT/OT. Pain management. Per orthopedics.  #2 dehydration D/C IV fluids.  #3 heart murmur 2-D echo pending.  #4 urinary tract infection Urine cultures are pending. Continue IV Rocephin.  #5 hypertension Not on any antihypertensive medications.  #6 well controlled diabetes mellitus Hemoglobin A1c 6.7. CBGs have ranged from 189-193. Sliding scale insulin.  #7 Alzheimer's dementia Continue Depakote.  #8 history of thyroid disease TSH within normal limits at 4.152. Off thyroid medications. Outpatient followup.  #9 QTC prolongation Questionable etiology. Repeat EKG with resolution of QTC prolongation.   #10 prophylaxis PPI for GI prophylaxis. SCDs and aspirin for DVT prophylaxis per orthopedics.  Code Status: Full Family Communication: Updated husband at bedside. Disposition Plan: Probable SNF when medically stable hopefully tomorrow.   Consultants:  Orthopedics: Dr. Lajoyce Corners 10/13/2013  Procedures:  Chest x-ray 10/13/2013  X-ray of the left femur 10/13/2013  X-ray of the pelvis 10/13/2013  Left hip bipolar arthroplasty 10/14/2013 per Dr. Lajoyce Corners  Antibiotics:  IV Rocephin 10/13/13  HPI/Subjective: Patient awake. No complaints. Fiddling with mittens.  Objective: Filed Vitals:   10/15/13 0800  BP:   Pulse:   Temp:   Resp: 16    Intake/Output Summary (Last 24 hours) at 10/15/13 1411 Last data filed at 10/15/13 1204  Gross per 24 hour  Intake   1370 ml  Output   1400 ml  Net    -30 ml   Filed Weights   10/13/13 1827  Weight: 43.6 kg (96 lb 1.9 oz)    Exam:   General:  nad  Cardiovascular: RRR  Respiratory: CTAB  Abdomen: Soft/NT/ND/+BS  Musculoskeletal: No c/c/e    Data Reviewed: Basic Metabolic Panel:  Recent Labs Lab 10/13/13 1631 10/14/13 0543 10/15/13 0515  NA 132* 136 132*  K 3.7 3.6 3.6  CL 98 104 99  CO2 21 21 21   GLUCOSE 276* 172* 224*  BUN 32* 27* 20  CREATININE 1.07 0.87 0.87  CALCIUM 9.6 8.8 8.7   Liver Function Tests:  Recent Labs Lab 10/13/13 1631  AST 17  ALT 12  ALKPHOS 88  BILITOT 0.4  PROT 7.1  ALBUMIN 3.4*   No results found for this basename: LIPASE, AMYLASE,  in the last 168 hours No results found for this basename: AMMONIA,  in the last 168 hours CBC:  Recent Labs Lab 10/13/13 1631 10/14/13 0543 10/15/13 0515  WBC 10.9* 8.1 6.7  HGB 11.9* 10.6* 9.9*  HCT 36.0 32.3* 30.2*  MCV 82.2 81.6 82.1  PLT 217 167 155   Cardiac Enzymes: No results found for this basename: CKTOTAL, CKMB, CKMBINDEX, TROPONINI,  in the last 168 hours BNP (last 3 results) No results found for this basename: PROBNP,  in the last 8760 hours CBG:  Recent Labs Lab 10/14/13 0732 10/14/13 1240 10/14/13 1851 10/14/13 2118 10/15/13 0721  GLUCAP 193* 147* 167* 212* 193*    Recent Results (from the past 240 hour(s))  URINE CULTURE     Status: None   Collection Time    10/13/13  8:37 PM      Result Value Range Status   Specimen Description URINE, CATHETERIZED   Final   Special Requests NONE   Final   Culture  Setup Time     Final   Value: 10/14/2013 05:44  Performed at [facility] Count     Final   Value: >=100,000 COLONIES/ML     Performed at [facility]     Final   Value: STAPHYLOCOCCUS SPECIES (COAGULASE NEGATIVE)     Note: RIFAMPIN AND GENTAMICIN SHOULD NOT BE USED AS SINGLE DRUGS FOR TREATMENT OF STAPH INFECTIONS.     Performed at [facility]   Report Status PENDING   Incomplete  MRSA PCR SCREENING     Status: None   Collection Time    10/13/13 11:47 PM      Result Value Range Status   MRSA by PCR NEGATIVE  NEGATIVE Final   Comment:            The GeneXpert  MRSA Assay (FDA     approved for NASAL specimens     only), is one component of a     comprehensive MRSA colonization     surveillance program. It is not     intended to diagnose MRSA     infection nor to guide or     monitor treatment for     MRSA infections.     Studies: Dg Chest 1 View  10/13/2013   CLINICAL DATA:  Fall, left hip pain  EXAM: CHEST - 1 VIEW  COMPARISON:  04/05/2013  FINDINGS: Cardiomediastinal silhouette is stable. No acute infiltrate or pleural effusion. No pulmonary edema. No diagnostic pneumothorax.  IMPRESSION: No active disease.  No diagnostic pneumothorax.   Electronically Signed   By: Natasha Mead M.D.   On: 10/13/2013 16:01   Dg Pelvis 1-2 Views  10/13/2013   CLINICAL DATA:  Left hip pain following a fall.  EXAM: PELVIS - 1-2 VIEW  COMPARISON:  Abdomen radiographs dated 10/16/2006.  FINDINGS: Left femoral neck fracture with varus angulation and proximal displacement of the distal fragment. Diffuse osteopenia. Atheromatous arterial calcifications. Lower lumbar spine scoliosis.  IMPRESSION: Left femoral neck fracture, as described above.   Electronically Signed   By: Gordan Payment M.D.   On: 10/13/2013 16:03   Dg Femur Left  10/13/2013   CLINICAL DATA:  Left hip pain following a fall.  EXAM: LEFT FEMUR - 2 VIEW  COMPARISON:  Left hip radiographs obtained at the same time.  FINDINGS: Previously described left femoral neck fracture. No additional fractures or dislocation. Diffuse osteopenia.  IMPRESSION: Previously described left femoral neck fracture. Diffuse osteopenia.   Electronically Signed   By: Gordan Payment M.D.   On: 10/13/2013 16:03   Ct Head Wo Contrast  10/13/2013   CLINICAL DATA:  Fall, dementia  EXAM: CT HEAD WITHOUT CONTRAST  TECHNIQUE: Contiguous axial images were obtained from the base of the skull through the vertex without intravenous contrast.  COMPARISON:  04/03/2013  FINDINGS: No skull fracture is noted. Paranasal sinuses and mastoid air cells are  unremarkable. No intracranial hemorrhage, mass effect or midline shift. Stable lacunar infarct in left basal ganglia. Stable atrophy and chronic white matter disease. No acute cortical infarction. No mass lesion is noted on this unenhanced scan.  IMPRESSION: No acute intracranial abnormality. Stable atrophy and chronic white matter disease. Again noted small lacunar infarct in left basal ganglia.   Electronically Signed   By: Natasha Mead M.D.   On: 10/13/2013 16:06    Scheduled Meds: . acetaminophen  500 mg Oral TID  . aspirin EC  325 mg Oral Q breakfast  . cefTRIAXone (ROCEPHIN)  IV  1 g Intravenous Q24H  .  docusate sodium  100 mg Oral BID  . insulin aspart  0-9 Units Subcutaneous TID WC  . lactose free nutrition  237 mL Oral TID WC  . pantoprazole  40 mg Oral Q0600  . valproate sodium  250 mg Intravenous Q12H   Continuous Infusions: . sodium chloride Stopped (10/15/13 1059)  . sodium chloride 20 mL/hr at 10/15/13 1218    Principal Problem:   Left displaced femoral neck fracture Active Problems:   Weakness   Urinary tract infection   Alzheimer's dementia   Unspecified constipation   Thyroid disease   HTN (hypertension)   Dehydration   Diabetes mellitus   Murmur, heart   Protein-calorie malnutrition, severe    Time spent: 22 MINS    [facility] MD [facility] Pager [phone]. If 7PM-7AM, please contact night-coverage at [email or web address], password [facility] 10/15/2013, 2:11 PM  LOS: 2 days

## 2013-10-15 NOTE — Progress Notes (Signed)
Clinical Social Work Department BRIEF PSYCHOSOCIAL ASSESSMENT 10/15/2013  Patient:  Anne [name], Anne [name]     Account Number:  [id number]     Admit date:  10/13/2013  Clinical Social Worker:  Candie Chroman  Date/Time:  10/15/2013 12:29 PM  Referred by:  Physician  Date Referred:  10/14/2013 Referred for  SNF Placement   Other Referral:   Interview type:  Family Other interview type:    PSYCHOSOCIAL DATA Living Status:  HUSBAND Admitted from facility:   Level of care:   Primary support name:  [employer] Primary support relationship to patient:  SPOUSE Degree of support available:   supportive    CURRENT CONCERNS Current Concerns  Post-Acute Placement   Other Concerns:    SOCIAL WORK ASSESSMENT / PLAN Pt is a 79 yr old female living at home with spouse prior to hospitalization.Pt has dementia and is unable to participate in meaningful conversation.  CSW met with spouse to assist with d/c planning. Pt will need SNF placement following hospital d/c. SNF search has been initiated and bed offers provided. Spouse has accepted placement at [facility].   Assessment/plan status:  Psychosocial Support/Ongoing Assessment of Needs Other assessment/ plan:   Information/referral to community resources:   Insurance covered for SNF reviewed. SNF list provided.    PATIENT'S/FAMILY'S RESPONSE TO PLAN OF CARE: " I'd like my wife to have rehab at [facility]. She has been there in the past and it's very close to my house. "   Cori Razor LCSW [phone]

## 2013-10-15 NOTE — Evaluation (Signed)
Physical Therapy Evaluation Patient Details Name: Anne [name] MRN: [id number] DOB: 19-Sep-1916 Today's Date: 10/15/2013 Time: 1478-2956 PT Time Calculation (min): 18 min  PT Assessment / Plan / Recommendation History of Present Illness  s/p Left hip hemiarthroplasty  Clinical Impression  Pt will benefit from PT to address deficits below    PT Assessment  Patient needs continued PT services    Follow Up Recommendations  SNF    Does the patient have the potential to tolerate intense rehabilitation      Barriers to Discharge        Equipment Recommendations  Rolling walker with 5" wheels    Recommendations for Other Services     Frequency Min 3X/week    Precautions / Restrictions Precautions Precautions: Posterior Hip;Fall Restrictions Other Position/Activity Restrictions: WBAT LLE Family instructed in posterior hip precautions   Pertinent Vitals/Pain VSS; min c/opain left hip      Mobility  Bed Mobility Bed Mobility: Supine to Sit Supine to Sit: 1: +2 Total assist Supine to Sit: Patient Percentage: 20% Details for Bed Mobility Assistance: +2 for trunk, LEs and to maintain THP Transfers Transfers: Sit to Stand;Stand to Sit;Stand Pivot Transfers Sit to Stand: 1: +2 Total assist Sit to Stand: Patient Percentage: 20% (times 2) Stand to Sit: 1: +2 Total assist Stand to Sit: Patient Percentage: 20% Stand Pivot Transfers: 1: +2 Total assist Stand Pivot Transfers: Patient Percentage: 20% Details for Transfer Assistance: cues for hand  placement,  safety,  THP; +2 for wt shift, pivot, lines, THP Ambulation/Gait Ambulation/Gait Assistance: Not tested (comment)    Exercises     PT Diagnosis: Difficulty walking;Generalized weakness  PT Problem List: Decreased strength;Decreased range of motion;Decreased activity tolerance;Decreased balance;Decreased mobility;Decreased knowledge of precautions;Decreased knowledge of use of DME;Decreased safety awareness PT Treatment  Interventions: DME instruction;Gait training;Functional mobility training;Therapeutic activities;Therapeutic exercise;Patient/family education     PT Goals(Current goals can be found in the care plan section) Acute Rehab PT Goals Patient Stated Goal: home PT Goal Formulation: With patient Time For Goal Achievement: 10/22/13 Potential to Achieve Goals: Good  Visit Information  Last PT Received On: 10/15/13 Assistance Needed: +1 History of Present Illness: s/p Left hip hemiarthroplasty       Prior Functioning  Home Living Family/patient expects to be discharged to:: Skilled nursing facility Living Arrangements: Spouse/significant other Prior Function Level of Independence: Needs assistance Gait / Transfers Assistance Needed: walks with husbands assistance Communication Communication:  (alzheirmer's)    Cognition  Cognition Arousal/Alertness: Awake/alert Behavior During Therapy: Restless Overall Cognitive Status: History of cognitive impairments - at baseline    Extremity/Trunk Assessment Upper Extremity Assessment Upper Extremity Assessment: Generalized weakness Lower Extremity Assessment Lower Extremity Assessment: Generalized weakness;LLE deficits/detail LLE: Unable to fully assess due to pain   Balance Static Sitting Balance Static Sitting - Balance Support: Bilateral upper extremity supported;Feet supported Static Sitting - Level of Assistance: 4: Min assist;5: Stand by assistance  End of Session PT - End of Session Equipment Utilized During Treatment: Gait belt Activity Tolerance: Patient tolerated treatment well Patient left: in chair;with family/visitor present;with call bell/phone within reach  GP     [facility] 10/15/2013, 10:13 AM

## 2013-10-15 NOTE — Progress Notes (Signed)
OT Cancellation Note  Patient Details Name: Anne [name] MRN: [id number] DOB: [birth year]   Cancelled Treatment:    Reason Eval/Treat Not Completed: Other (comment) Note pt +2 total with transfer only today. Will check back next date for OT.  Lennox Laity [phone] 10/15/2013, 12:59 PM

## 2013-10-15 NOTE — Progress Notes (Signed)
Clinical Social Work Department CLINICAL SOCIAL WORK PLACEMENT NOTE 10/15/2013  Patient:  Anne [name], Anne [name]  Account Number:  [id number] Admit date:  10/13/2013  Clinical Social Worker:  Cori Razor, LCSW  Date/time:  10/15/2013 12:39 PM  Clinical Social Work is seeking post-discharge placement for this patient at the following level of care:   SKILLED NURSING   (*CSW will update this form in Epic as items are completed)   10/15/2013  Patient/family provided with [facility] Department of Clinical Social Work's list of facilities offering this level of care within the geographic area requested by the patient (or if unable, by the patient's family).    Patient/family informed of their freedom to choose among providers that offer the needed level of care, that participate in Medicare, Medicaid or managed care program needed by the patient, have an available bed and are willing to accept the patient.  10/15/2013  Patient/family informed of [facility]' ownership interest in [facility], as well as of the fact that they are under no obligation to receive care at this facility.  PASARR submitted to EDS on  PASARR number received from EDS on 04/04/2013  FL2 transmitted to all facilities in geographic area requested by pt/family on  10/15/2013 FL2 transmitted to all facilities within larger geographic area on   Patient informed that his/her managed care company has contracts with or will negotiate with  certain facilities, including the following:     Patient/family informed of bed offers received:  10/15/2013 Patient chooses bed at [facility] Physician recommends and patient chooses bed at    Patient to be transferred to [facility] on   Patient to be transferred to facility by   The following physician request were entered in Epic:   Additional Comments:  Cori Razor LCSW [phone]

## 2013-10-15 NOTE — Progress Notes (Signed)
Patient ID: Anne [name], female   DOB: Jan 24, 1916, 79 y.o.   MRN: [id number] Postoperative day 1 left hip hemiarthroplasty. Patient will need discharge to skilled nursing. Will have dressing changed today and as needed.

## 2013-10-15 NOTE — Progress Notes (Signed)
INITIAL NUTRITION ASSESSMENT  Pt meets criteria for severe MALNUTRITION in the context of chronic illness as evidenced by severe muscle wasting and subcutaneous fat loss in temporal and clavicle region.  DOCUMENTATION CODES Per approved criteria  -Severe malnutrition in the context of chronic illness -Underweight   INTERVENTION: - Boost Plus TID - Will continue to monitor  NUTRITION DIAGNOSIS: Inadequate oral intake related to poor appetite, dementia as evidenced by <50% meal intake.   Goal: Pt to consume >90% of meals/supplements  Monitor:  Weights, labs, intake  Reason for Assessment: consult   79 y.o. female  Admitting Dx: Left displaced femoral neck fracture  ASSESSMENT: Pt with advanced dementia with hx of frequent falls, DM, admitted with fall, found to have left femoral neck fracture and had surgical repair yesterday. Per H&P, pt has lost between 10-14 pounds in the past 6 months however husband reports pt's weight has gone up from 78 pounds from last doctor's visit (last week) to 96 pounds currently. Past weight trend shows pt's weight stable since May of this year. Pt asleep during visit, husband reports pt eating well PTA - eats a good breakfast and then goes out to [employer] for dinner. Denies any problems chewing. Drinks 2 Boost/day. Husband reports recently pt's doctor stopped pt's DM medications as her blood sugars were under control.  Lab Results  Component Value Date   HGBA1C 6.7* 10/13/2013    Nutrition Focused Physical Exam:  Subcutaneous Fat:  Orbital Region: mild/moderate wasting Upper Arm Region: mild/moderate wasting Thoracic and Lumbar Region: mild/moderate wasting  Muscle:  Temple Region: severe wasting Clavicle Bone Region: severe wasting Clavicle and Acromion Bone Region: severe wasting Scapular Bone Region: NA Dorsal Hand: NA Patellar Region: NA Anterior Thigh Region: NA Posterior Calf Region: NA  Edema: None noted   Height: Ht Readings  from Last 1 Encounters:  10/13/13 5\' 3"  (1.6 m)    Weight: Wt Readings from Last 1 Encounters:  10/13/13 96 lb 1.9 oz (43.6 kg)    Ideal Body Weight: 115 lb  % Ideal Body Weight: 83%  Wt Readings from Last 10 Encounters:  10/13/13 96 lb 1.9 oz (43.6 kg)  10/13/13 96 lb 1.9 oz (43.6 kg)  04/03/13 96 lb 12.5 oz (43.9 kg)    Usual Body Weight: 78 lb per husband   % Usual Body Weight: 123%  BMI:  Body mass index is 17.03 kg/(m^2). underweight  Estimated Nutritional Needs: Kcal: 1300-1500 Protein: 50-65g Fluid: 1.3-1.5L/day  Skin: left hip surgical incision   Diet Order: Dysphagia 2, thin  EDUCATION NEEDS: -No education needs identified at this time   Intake/Output Summary (Last 24 hours) at 10/15/13 1257 Last data filed at 10/15/13 1204  Gross per 24 hour  Intake   1370 ml  Output   1400 ml  Net    -30 ml    Last BM: 12/8  Labs:   Recent Labs Lab 10/13/13 1631 10/14/13 0543 10/15/13 0515  NA 132* 136 132*  K 3.7 3.6 3.6  CL 98 104 99  CO2 21 21 21   BUN 32* 27* 20  CREATININE 1.07 0.87 0.87  CALCIUM 9.6 8.8 8.7  GLUCOSE 276* 172* 224*    CBG (last 3)   Recent Labs  10/14/13 1851 10/14/13 2118 10/15/13 0721  GLUCAP 167* 212* 193*    Scheduled Meds: . acetaminophen  500 mg Oral TID  . aspirin EC  325 mg Oral Q breakfast  . cefTRIAXone (ROCEPHIN)  IV  1 g Intravenous Q24H  .  docusate sodium  100 mg Oral BID  . feeding supplement (ENSURE COMPLETE)  237 mL Oral BID BM  . insulin aspart  0-9 Units Subcutaneous TID WC  . pantoprazole  40 mg Oral Q0600  . valproate sodium  250 mg Intravenous Q12H    Continuous Infusions: . sodium chloride Stopped (10/15/13 1059)  . sodium chloride 20 mL/hr at 10/15/13 1218    Past Medical History  Diagnosis Date  . Diabetes mellitus   . Dementia   . Thyroid disease   . Hypertension   . Murmur, heart 10/13/2013    Past Surgical History  Procedure Laterality Date  . Abdominal hysterectomy  1946     partial    Levon Hedger MS, RD, [place] [phone] Pager [phone] After Hours Pager

## 2013-10-16 ENCOUNTER — Encounter (HOSPITAL_COMMUNITY): Payer: Self-pay | Admitting: Orthopedic Surgery

## 2013-10-16 DIAGNOSIS — I10 Essential (primary) hypertension: Secondary | ICD-10-CM

## 2013-10-16 DIAGNOSIS — IMO0001 Reserved for inherently not codable concepts without codable children: Secondary | ICD-10-CM | POA: Diagnosis present

## 2013-10-16 LAB — CBC
HCT: 25.8 % — ABNORMAL LOW (ref 36.0–46.0)
HCT: 27.5 % — ABNORMAL LOW (ref 36.0–46.0)
Hemoglobin: 8.5 g/dL — ABNORMAL LOW (ref 12.0–15.0)
Hemoglobin: 9.3 g/dL — ABNORMAL LOW (ref 12.0–15.0)
MCH: 26.8 pg (ref 26.0–34.0)
MCH: 27.3 pg (ref 26.0–34.0)
MCHC: 32.9 g/dL (ref 30.0–36.0)
MCV: 81.4 fL (ref 78.0–100.0)
MCV: 80.6 fL (ref 78.0–100.0)
Platelets: 165 10*3/uL (ref 150–400)
RBC: 3.41 MIL/uL — ABNORMAL LOW (ref 3.87–5.11)
WBC: 7.7 10*3/uL (ref 4.0–10.5)

## 2013-10-16 LAB — IRON AND TIBC
Iron: <10 ug/dL — ABNORMAL LOW (ref 42–135)
UIBC: 242 ug/dL (ref 125–400)

## 2013-10-16 LAB — BASIC METABOLIC PANEL
BUN: 20 mg/dL (ref 6–23)
CO2: 20 mEq/L (ref 19–32)
Calcium: 8.5 mg/dL (ref 8.4–10.5)
Creatinine, Ser: 0.8 mg/dL (ref 0.50–1.10)
GFR calc Af Amer: 70 mL/min — ABNORMAL LOW (ref >90)
GFR calc non Af Amer: 61 mL/min — ABNORMAL LOW (ref >90)
Glucose, Bld: 177 mg/dL — ABNORMAL HIGH (ref 70–99)
Potassium: 3.2 mEq/L — ABNORMAL LOW (ref 3.5–5.1)

## 2013-10-16 LAB — GLUCOSE, CAPILLARY: Glucose-Capillary: 177 mg/dL — ABNORMAL HIGH (ref 70–99)

## 2013-10-16 LAB — URINE CULTURE: Colony Count: >=100000

## 2013-10-16 MED ORDER — ACETAMINOPHEN 500 MG PO TABS: 500.0000 mg | ORAL_TABLET | Freq: Three times a day (TID) | ORAL | Status: DC

## 2013-10-16 MED ORDER — LORAZEPAM 0.5 MG PO TABS: 0.5000 mg | ORAL_TABLET | Freq: Two times a day (BID) | ORAL | Status: DC | PRN

## 2013-10-16 MED ORDER — DIVALPROEX SODIUM 125 MG PO CPSP
250.0000 mg | ORAL_CAPSULE | Freq: Two times a day (BID) | ORAL | Status: DC
  Filled 2013-10-16: qty 2

## 2013-10-16 MED ORDER — POTASSIUM CHLORIDE CRYS ER 20 MEQ PO TBCR
40.0000 meq | EXTENDED_RELEASE_TABLET | ORAL | Status: AC
  Administered 2013-10-16 (×2): 40 meq via ORAL
  Filled 2013-10-16 (×2): qty 2

## 2013-10-16 MED ORDER — ASPIRIN 81 MG PO CHEW: 81.0000 mg | CHEWABLE_TABLET | Freq: Every day | ORAL | Status: DC

## 2013-10-16 MED ORDER — TRAMADOL HCL 50 MG PO TABS: 50.0000 mg | ORAL_TABLET | Freq: Four times a day (QID) | ORAL | Status: DC | PRN

## 2013-10-16 MED ORDER — BOOST PLUS PO LIQD: 237.0000 mL | Freq: Three times a day (TID) | ORAL | Status: DC

## 2013-10-16 NOTE — Progress Notes (Signed)
Report called and given to the receiving SNF, patient still awaiting pick up by [facility].

## 2013-10-16 NOTE — Discharge Summary (Addendum)
Physician Discharge Summary  ROSSANA MOLCHAN GEX:[id number] DOB: Dec 01, 1915 DOA: 10/13/2013  PCP: Kari Baars, MD  Admit date: 10/13/2013 Discharge date: 10/16/2013  Time spent: 65 minutes  Recommendations for Outpatient Follow-up:  1. patient is to followup with Kari Baars, MD 1-2 weeks post discharge. On followup patient will need a CBC done to followup on H&H. Patient need a basic metabolic profile done to followup on electrolytes and renal function. 2-D echo which was done did during this hospitalization did note severe aortic valvular stenosis. Patient's case was discussed with cardiology, and it was felt that secondary to patient's age and comorbidities she is not a surgical candidate nor a candidate for TAVR. Patient will followup with PCP as outpatient.  2. Followup with Dr. Lajoyce Corners in 2 weeks.   Discharge Diagnoses:  Principal Problem:   Left displaced femoral neck fracture Active Problems:   Aortic stenosis, severe   Weakness   Urinary tract infection   Alzheimer's dementia   Unspecified constipation   Thyroid disease   HTN (hypertension)   Dehydration   Diabetes mellitus   Murmur, heart   Protein-calorie malnutrition, severe   Discharge Condition: Stable and improved  Diet recommendation: Regular  Filed Weights   10/13/13 1827  Weight: 43.6 kg (96 lb 1.9 oz)    History of present illness:  Anne [name] is a 79 y.o. female  With history of diabetes mellitus diet controlled, dementia, thyroid disease currently off any thyroid medications, history of a murmur per family, hypertension currently not on any medications as an ED with left hip pain after a fall. Patient currently sleeping after receiving some morphine however easily arousable with some confusion and a such history was obtained from patient's family. Her husband and son and husband stated that found patient on the floor and then around 2 AM on the morning of admission and patient was sleeping on the  floor. He awoke in the patient and she was complaining of left lower extremity pain. Husband was able to get the patient on the floor and noted that patient was unable to bear weight. Patient was subsequently brought to the ED for further evaluation. Family does endorse that patient has had about a 14 pound weight loss over the past 6 months and has been eating and drinking is a normal routine. No syncope noted. No fever, no chills, no nausea, no vomiting, no chest pain, no shortness of breath, no abdominal pain, no diarrhea, no constipation. Family states patient has been on MiraLAX daily. Family does endorse some dysuria.  Patient was seen in the emergency room compressive metabolic profile obtain a sodium of 132 BUN of 30 to glucose of 276 albumin of 3.4 otherwise was within normal limits. CBC white count of 10.9 hemoglobin of 11.9 otherwise was within normal limits. Valproic acid level is 20.5. Chest x-ray done was negative for any infiltrate. X-ray of the left femur showed left femoral neck fracture with diffuse osteopenia. Plain films of the pelvis showed left femoral neck fracture. ED physician stated he spoke with Dr. Lajoyce Corners of orthopedics on call who stated he will consult on the patient.  We were called to admit the patient for further evaluation and management.   Hospital Course:  #1. Left displaced femoral neck fracture  Patient was admitted with a left displaced femoral neck fracture secondary to mechanical fall. Patient was admitted to a MedSurg floor an orthopedic consultation was obtained. The patient was seen in consultation by Dr. Lajoyce Corners. Patient subsequently underwent left  hip bipolar arthroplasty per Dr. Lajoyce Corners. Patient tolerated procedure well with no complications. Patient was seen by PT OT. Patient will be discharged to a skilled nursing facility and will followup with orthopedics, Dr. Lajoyce Corners 2 weeks post discharge. She'll be discharged on aspirin for DVT prophylaxis.  #2 dehydration  On  admission patient was noted to be dehydrated. Patient was hydrated with IV fluids and was euvolemic by day of discharge. #3 heart murmur/severe aortic valvular stenosis Patient on admission was noted to have a murmur. Per family patient has had a murmur for a long time. 2-D echo was obtained on 10/15/2013 which showed a normal systolic function with EF of 50-55%. Showed severe aortic valvular stenosis with a valve area of 1.15 cm and a mean gradient of 70 mm of mercury. Patient's case was discussed on the telephone with Dr. Gala Romney, Cardiology and it was felt that patient was none surgical candidate and not a candidate for TAVR secondary to age and comorbidities. Patient has such will be discharged in stable condition on the followup with PCP as outpatient. #4 probable urinary tract infection  On admission urinalysis which was done was worrisome for urinary tract infection. Nitrite was positive however WBCs were 0-2. Patient was placed empirically on IV Rocephin. Urine cultures which were obtained grew greater than 100,000 colonies of staph quietness negative species. Patient received 4 days worth of IV Rocephin which was adequate.  #5 hypertension  Not on any antihypertensive medications.  #6 well controlled diabetes mellitus  Hemoglobin A1c 6.7. CBGs have ranged from 189-193. Patient was maintained on sliding scale insulin during the hospitalization. #7 Alzheimer's dementia  Continued on Depakote.  #8 history of thyroid disease  TSH within normal limits at 4.152. Off thyroid medications. Outpatient followup.  #9 QTC prolongation  Questionable etiology. Repeat EKG with resolution of QTC prolongation. #10 anemia/acute blood loss anemia  Postoperatively patient was noted to have an anemia which stabilized at 9.3. Patient did not have any overt GI bleed. Likely secondary to recent surgery. Patient remained asymptomatic. Patient will followup with PCP as outpatient.     Procedures: Chest x-ray  10/13/2013  X-ray of the left femur 10/13/2013  X-ray of the pelvis 10/13/2013  Left hip bipolar arthroplasty 10/14/2013 per Dr. Lajoyce Corners 2 D Echo  10/15/13     Consultations: Orthopedics: Dr. Lajoyce Corners 10/13/2013   Discharge Exam: Filed Vitals:   10/16/13 1437  BP: 91/51  Pulse: 75  Temp: 97.5 F (36.4 C)  Resp: 18    General: NAD Cardiovascular: RRR with 3/6 SEM Respiratory: CTAB  Discharge Instructions      Discharge Orders   Future Orders Complete By Expires   Diet general  As directed    Discharge instructions  As directed    Comments:     Follow up with Kari Baars, MD in 2 weeks.   Weight bearing as tolerated  As directed    Questions:     Laterality:  left   Extremity:  Lower       Medication List         acetaminophen 500 MG tablet  Commonly known as:  TYLENOL  Take 1 tablet (500 mg total) by mouth 3 (three) times daily.     aspirin 81 MG chewable tablet  Chew 1 tablet (81 mg total) by mouth daily.  Start taking on:  10/17/2013     divalproex 125 MG capsule  Commonly known as:  DEPAKOTE SPRINKLE  Take 250 mg by mouth 2 (two) times  daily.     feeding supplement (ENSURE COMPLETE) Liqd  Take 237 mLs by mouth 2 (two) times daily between meals.     lactose free nutrition Liqd  Take 237 mLs by mouth 3 (three) times daily with meals.     LORazepam 0.5 MG tablet  Commonly known as:  ATIVAN  Take 1 tablet (0.5 mg total) by mouth every 12 (twelve) hours as needed for anxiety.     traMADol 50 MG tablet  Commonly known as:  ULTRAM  Take 1 tablet (50 mg total) by mouth every 6 (six) hours as needed. Maximum dose= 8 tablets per day       No Known Allergies Follow-up Information   Follow up with DUDA,MARCUS V, MD In 2 weeks.   Specialty:  Orthopedic Surgery   Contact information:   [address] [place] [address] [phone]       Follow up with Kari Baars, MD. Schedule an appointment as soon as possible for a visit in 1 week.  (f/u with PCP in 1-2 weeks.)    Specialty:  Internal Medicine   Contact information:   [address] [facility] [place] [address] [phone]        The results of significant diagnostics from this hospitalization (including imaging, microbiology, ancillary and laboratory) are listed below for reference.    Significant Diagnostic Studies: Dg Chest 1 View  10/13/2013   CLINICAL DATA:  Fall, left hip pain  EXAM: CHEST - 1 VIEW  COMPARISON:  04/05/2013  FINDINGS: Cardiomediastinal silhouette is stable. No acute infiltrate or pleural effusion. No pulmonary edema. No diagnostic pneumothorax.  IMPRESSION: No active disease.  No diagnostic pneumothorax.   Electronically Signed   By: Natasha Mead M.D.   On: 10/13/2013 16:01   Dg Pelvis 1-2 Views  10/13/2013   CLINICAL DATA:  Left hip pain following a fall.  EXAM: PELVIS - 1-2 VIEW  COMPARISON:  Abdomen radiographs dated 10/16/2006.  FINDINGS: Left femoral neck fracture with varus angulation and proximal displacement of the distal fragment. Diffuse osteopenia. Atheromatous arterial calcifications. Lower lumbar spine scoliosis.  IMPRESSION: Left femoral neck fracture, as described above.   Electronically Signed   By: Gordan Payment M.D.   On: 10/13/2013 16:03   Dg Femur Left  10/13/2013   CLINICAL DATA:  Left hip pain following a fall.  EXAM: LEFT FEMUR - 2 VIEW  COMPARISON:  Left hip radiographs obtained at the same time.  FINDINGS: Previously described left femoral neck fracture. No additional fractures or dislocation. Diffuse osteopenia.  IMPRESSION: Previously described left femoral neck fracture. Diffuse osteopenia.   Electronically Signed   By: Gordan Payment M.D.   On: 10/13/2013 16:03   Ct Head Wo Contrast  10/13/2013   CLINICAL DATA:  Fall, dementia  EXAM: CT HEAD WITHOUT CONTRAST  TECHNIQUE: Contiguous axial images were obtained from the base of the skull through the vertex without intravenous contrast.  COMPARISON:   04/03/2013  FINDINGS: No skull fracture is noted. Paranasal sinuses and mastoid air cells are unremarkable. No intracranial hemorrhage, mass effect or midline shift. Stable lacunar infarct in left basal ganglia. Stable atrophy and chronic white matter disease. No acute cortical infarction. No mass lesion is noted on this unenhanced scan.  IMPRESSION: No acute intracranial abnormality. Stable atrophy and chronic white matter disease. Again noted small lacunar infarct in left basal ganglia.   Electronically Signed   By: Natasha Mead M.D.   On: 10/13/2013 16:06  Microbiology: Recent Results (from the past 240 hour(s))  URINE CULTURE     Status: None   Collection Time    10/13/13  8:37 PM      Result Value Range Status   Specimen Description URINE, CATHETERIZED   Final   Special Requests NONE   Final   Culture  Setup Time     Final   Value: 10/14/2013 05:44     Performed at [facility] Count     Final   Value: >=100,000 COLONIES/ML     Performed at [facility]   Culture     Final   Value: STAPHYLOCOCCUS SPECIES (COAGULASE NEGATIVE)     Note: RIFAMPIN AND GENTAMICIN SHOULD NOT BE USED AS SINGLE DRUGS FOR TREATMENT OF STAPH INFECTIONS.     Performed at [facility]   Report Status 10/16/2013 FINAL   Final   Organism ID, Bacteria STAPHYLOCOCCUS SPECIES (COAGULASE NEGATIVE)   Final  MRSA PCR SCREENING     Status: None   Collection Time    10/13/13 11:47 PM      Result Value Range Status   MRSA by PCR NEGATIVE  NEGATIVE Final   Comment:            The GeneXpert MRSA Assay (FDA     approved for NASAL specimens     only), is one component of a     comprehensive MRSA colonization     surveillance program. It is not     intended to diagnose MRSA     infection nor to guide or     monitor treatment for     MRSA infections.     Labs: Basic Metabolic Panel:  Recent Labs Lab 10/13/13 1631 10/14/13 0543 10/15/13 0515 10/16/13 0555  NA 132* 136 132*  134*  K 3.7 3.6 3.6 3.2*  CL 98 104 99 101  CO2 21 21 21 20   GLUCOSE 276* 172* 224* 177*  BUN 32* 27* 20 20  CREATININE 1.07 0.87 0.87 0.80  CALCIUM 9.6 8.8 8.7 8.5   Liver Function Tests:  Recent Labs Lab 10/13/13 1631  AST 17  ALT 12  ALKPHOS 88  BILITOT 0.4  PROT 7.1  ALBUMIN 3.4*   No results found for this basename: LIPASE, AMYLASE,  in the last 168 hours No results found for this basename: AMMONIA,  in the last 168 hours CBC:  Recent Labs Lab 10/13/13 1631 10/14/13 0543 10/15/13 0515 10/16/13 0555 10/16/13 1414  WBC 10.9* 8.1 6.7 7.1 7.7  HGB 11.9* 10.6* 9.9* 8.5* 9.3*  HCT 36.0 32.3* 30.2* 25.8* 27.5*  MCV 82.2 81.6 82.1 81.4 80.6  PLT 217 167 155 147* 165   Cardiac Enzymes: No results found for this basename: CKTOTAL, CKMB, CKMBINDEX, TROPONINI,  in the last 168 hours BNP: BNP (last 3 results) No results found for this basename: PROBNP,  in the last 8760 hours CBG:  Recent Labs Lab 10/15/13 1136 10/15/13 1631 10/15/13 2304 10/16/13 0756 10/16/13 1153  GLUCAP 192* 189* 213* 181* 177*       Signed:  Samarion Ehle M.D. [facility] 10/16/2013, 3:59 PM

## 2013-10-16 NOTE — Progress Notes (Signed)
Transporters here to pick up Pt. She is dressed in her street clothing  And shoes left behind by her husband and son. She left via stretcher free of pain, discomfort or respiratory distress.

## 2013-10-16 NOTE — Progress Notes (Signed)
This patient is receiving Valproate. Based on criteria approved by the Pharmacy and Therapeutics Committee, this medication is being converted to the equivalent oral dose form. These criteria include:   . The patient is eating (either orally or per tube) and/or has been taking other orally administered medications for at least 24 hours.  . This patient has no evidence of active gastrointestinal bleeding or impaired GI absorption (gastrectomy, short bowel, patient on TNA or NPO).   If you have questions about this conversion, please contact the pharmacy department.  Clance Boll, [facility] 10/16/2013 1:26 PM

## 2013-10-16 NOTE — Progress Notes (Signed)
OT Cancellation Note  Patient Details Name: Anne [name] MRN: [id number] DOB: 1916/06/14   Cancelled Treatment:    Reason Eval/Treat Not Completed: Other (comment) Pt progressing slowly with PT, note pivot to chair only with some ability to help more. Pt eating lunch when OT arrived. Will check on pt as schedule allows. Per notes, pt may d/c later today.  Lennox Laity [phone] 10/16/2013, 1:11 PM

## 2013-10-16 NOTE — Progress Notes (Signed)
SNF bed available at [facility] today if pt is stable for d/c.  Cori Razor LCSW [phone]

## 2013-10-16 NOTE — Progress Notes (Signed)
Physical Therapy Treatment Patient Details Name: Anne [name] MRN: [id number] DOB: 07/16/1916 Today's Date: 10/16/2013 Time: 4098-1191 PT Time Calculation (min): 13 min  PT Assessment / Plan / Recommendation  History of Present Illness s/p Left hip hemiarthroplasty   PT Comments   Pt progressing today, able to do slightly more of transfer with decr c/o pain  Follow Up Recommendations  SNF     Does the patient have the potential to tolerate intense rehabilitation     Barriers to Discharge        Equipment Recommendations  Rolling walker with 5" wheels    Recommendations for Other Services    Frequency Min 3X/week   Progress towards PT Goals Progress towards PT goals: Progressing toward goals  Plan Current plan remains appropriate    Precautions / Restrictions Precautions Precautions: Posterior Hip;Fall Restrictions Other Position/Activity Restrictions: WBAT LLE   Pertinent Vitals/Pain     Mobility  Bed Mobility Bed Mobility: Supine to Sit Supine to Sit: 1: +1 Total assist;HOB flat Details for Bed Mobility Assistance: assist for trunk, LEs and to maintain THP Transfers Transfers: Sit to Stand;Stand to Sit;Stand Pivot Transfers Sit to Stand: 1: +2 Total assist Sit to Stand: Patient Percentage: 40% Stand to Sit: 1: +2 Total assist Stand to Sit: Patient Percentage: 40% Stand Pivot Transfers: 1: +2 Total assist Stand Pivot Transfers: Patient Percentage: 40% Details for Transfer Assistance: cues for hand  placement,  safety,  THP; +2 for wt shift, pivot, lines, THP Ambulation/Gait Ambulation/Gait Assistance: Not tested (comment)    Exercises     PT Diagnosis:    PT Problem List:   PT Treatment Interventions:     PT Goals (current goals can now be found in the care plan section) Acute Rehab PT Goals Patient Stated Goal: home after rehab per family PT Goal Formulation: With patient Time For Goal Achievement: 10/22/13 Potential to Achieve Goals: Good  Visit  Information  Last PT Received On: 10/16/13 Assistance Needed: +1 History of Present Illness: s/p Left hip hemiarthroplasty    Subjective Data  Patient Stated Goal: home after rehab per family   Cognition  Cognition Arousal/Alertness: Awake/alert Behavior During Therapy: WFL for tasks assessed/performed Overall Cognitive Status: History of cognitive impairments - at baseline    Insurance risk surveyor Sitting - Balance Support: No upper extremity supported Static Sitting - Level of Assistance: 4: Min assist;5: Stand by assistance  End of Session PT - End of Session Equipment Utilized During Treatment: Gait belt Activity Tolerance: Patient tolerated treatment well Patient left: in chair;with call bell/phone within reach;with family/visitor present;with nursing/sitter in room Nurse Communication: Mobility status   GP     [facility] 10/16/2013, 12:34 PM

## 2013-10-17 ENCOUNTER — Other Ambulatory Visit: Payer: Self-pay | Admitting: *Deleted

## 2013-10-17 LAB — VITAMIN B12: Vitamin B-12: 646 pg/mL (ref 211–911)

## 2013-10-17 MED ORDER — TRAMADOL HCL 50 MG PO TABS: ORAL_TABLET | ORAL | Status: DC

## 2013-10-17 NOTE — Progress Notes (Signed)
Clinical Social Work Department CLINICAL SOCIAL WORK PLACEMENT NOTE 10/17/2013  Patient:  Anne [name], Anne [name]  Account Number:  [id number] Admit date:  10/13/2013  Clinical Social Worker:  Cori Razor, LCSW  Date/time:  10/15/2013 12:39 PM  Clinical Social Work is seeking post-discharge placement for this patient at the following level of care:   SKILLED NURSING   (*CSW will update this form in Epic as items are completed)   10/15/2013  Patient/family provided with [facility] Department of Clinical Social Work's list of facilities offering this level of care within the geographic area requested by the patient (or if unable, by the patient's family).    Patient/family informed of their freedom to choose among providers that offer the needed level of care, that participate in Medicare, Medicaid or managed care program needed by the patient, have an available bed and are willing to accept the patient.  10/15/2013  Patient/family informed of [facility]' ownership interest in [facility], as well as of the fact that they are under no obligation to receive care at this facility.  PASARR submitted to EDS on  PASARR number received from EDS on 04/04/2013  FL2 transmitted to all facilities in geographic area requested by pt/family on  10/15/2013 FL2 transmitted to all facilities within larger geographic area on   Patient informed that his/her managed care company has contracts with or will negotiate with  certain facilities, including the following:     Patient/family informed of bed offers received:  10/15/2013 Patient chooses bed at [facility] Physician recommends and patient chooses bed at    Patient to be transferred to [facility] on  10/16/2013 Patient to be transferred to facility by [facility]  The following physician request were entered in Epic:   Additional Comments: Family is aware that transport may not be covered by insurance.  Cori Razor LCSW  [phone]

## 2013-10-23 ENCOUNTER — Other Ambulatory Visit: Payer: Self-pay | Admitting: *Deleted

## 2013-10-23 ENCOUNTER — Non-Acute Institutional Stay (SKILLED_NURSING_FACILITY): Payer: Medicare Other | Admitting: Adult Health

## 2013-10-23 DIAGNOSIS — S72002P Fracture of unspecified part of neck of left femur, subsequent encounter for closed fracture with malunion: Secondary | ICD-10-CM

## 2013-10-23 DIAGNOSIS — F419 Anxiety disorder, unspecified: Secondary | ICD-10-CM

## 2013-10-23 DIAGNOSIS — F411 Generalized anxiety disorder: Secondary | ICD-10-CM

## 2013-10-23 DIAGNOSIS — IMO0002 Reserved for concepts with insufficient information to code with codable children: Secondary | ICD-10-CM

## 2013-10-23 DIAGNOSIS — E43 Unspecified severe protein-calorie malnutrition: Secondary | ICD-10-CM

## 2013-10-23 DIAGNOSIS — F0280 Dementia in other diseases classified elsewhere without behavioral disturbance: Secondary | ICD-10-CM

## 2013-10-23 MED ORDER — LORAZEPAM 0.5 MG PO TABS: ORAL_TABLET | ORAL | Status: DC

## 2013-10-24 ENCOUNTER — Encounter: Payer: Self-pay | Admitting: Adult Health

## 2013-10-24 DIAGNOSIS — F39 Unspecified mood [affective] disorder: Secondary | ICD-10-CM | POA: Insufficient documentation

## 2013-10-24 MED ORDER — LORAZEPAM 0.5 MG PO TABS: 0.2500 mg | ORAL_TABLET | Freq: Two times a day (BID) | ORAL | Status: DC | PRN

## 2013-10-24 MED ORDER — DIVALPROEX SODIUM 125 MG PO CPSP: 250.0000 mg | ORAL_CAPSULE | Freq: Every day | ORAL | Status: DC

## 2013-10-24 NOTE — Progress Notes (Signed)
Patient ID: Anne [name], female   DOB: 1916/09/22, 79 y.o.   MRN: [id number]     [facility]  No Known Allergies   Chief Complaint  Patient presents with  . Hospitalization Follow-up    HPI:  She has been hospitalized for left femur fracture requiring surgical intervention. She is here for short term intervention. She was found to have severe aortic valve stenosis; but was felt not to be a surgical candidate. She is here for short term rehab; however; at this time I feel as though she will become a long term resident.  She is too lethargic to participate in the hpi or ros today.   Past Medical History  Diagnosis Date  . Diabetes mellitus   . Dementia   . Thyroid disease   . Hypertension   . Murmur, heart 10/13/2013    Past Surgical History  Procedure Laterality Date  . Abdominal hysterectomy  1946    partial  . Hip arthroplasty Left 10/14/2013    Procedure: ARTHROPLASTY BIPOLAR HIP;  Surgeon: Nadara Mustard, MD;  Location: [facility];  Service: Orthopedics;  Laterality: Left;    VITAL SIGNS BP 116/63  Pulse 98  Ht 5\' 3"  (1.6 m)  Wt 79 lb (35.834 kg)  BMI 14.00 kg/m2   Patient's Medications  New Prescriptions   No medications on file  Previous Medications   ACETAMINOPHEN (TYLENOL) 500 MG TABLET    Take 1 tablet (500 mg total) by mouth 3 (three) times daily.   ASPIRIN 81 MG CHEWABLE TABLET    Chew 1 tablet (81 mg total) by mouth daily.   DIVALPROEX (DEPAKOTE SPRINKLE) 125 MG CAPSULE    Take 250 mg by mouth 2 (two) times daily.    FEEDING SUPPLEMENT (ENSURE COMPLETE) LIQD    Take 237 mLs by mouth 2 (two) times daily between meals.   LACTOSE FREE NUTRITION (BOOST PLUS) LIQD    Take 237 mLs by mouth 3 (three) times daily with meals.   TRAMADOL (ULTRAM) 50 MG TABLET    Take one tablet by mouth every 6 hours as needed  Modified Medications   Modified Medication Previous Medication   LORAZEPAM (ATIVAN) 0.5 MG TABLET LORazepam (ATIVAN) 0.5 MG tablet      Take 0.5 mg by  mouth 2 (two) times daily as needed.    Take 1/2 tablet by mouth every 12 hours as needed for anxiety  Discontinued Medications   No medications on file    SIGNIFICANT DIAGNOSTIC EXAMS  10-13-13: chest x-ray: No active disease.  No diagnostic pneumothorax.  10-13-13: pelvic x-ray: Left femoral neck fracture, as described above.   10-13-13: left femur x-ray: Previously described left femoral neck fracture. Diffuse osteopenia.  10-13-13: ct of head: No acute intracranial abnormality. Stable atrophy and chronic white matter disease. Again noted small lacunar infarct in left basal ganglia.  10-15-13; 2-d echo: Left ventricle: Wall thickness was increased in a pattern of moderate LVH. Systolic function was normal. The estimated ejection fraction was in the range of 50% to 55%. The study is not technically sufficient to allow evaluation of LV diastolic function.- Aortic valve: There was severe stenosis. - Mitral valve: Calcified annulus. Moderately thickened leaflets .   LABS REVIEWED:   10-13-13: wbc 10.9; hgb 11.9; hct 36.0 mcv 82.2; plt 217; glucose 276; bun 32; creat 1.07; k+3.7; na++136; liver normal albumin 3.4; tsh 4.152;hgb a1c 6.7; depakote 20.5 10-16-13; folate 10.5; ferritin 119; iron <10; vit b12: 646  Review of Systems  Unable to perform ROS   Physical Exam  Constitutional: No distress.  Frail; malnourished   Neck: Neck supple. No JVD present.  Cardiovascular: Normal rate, regular rhythm and intact distal pulses.   Murmur heard. Respiratory: Effort normal and breath sounds normal. No respiratory distress. She has no wheezes.  GI: Soft. Bowel sounds are normal. She exhibits no distension. There is no tenderness.  Musculoskeletal: She exhibits no edema.  Is able to move extremities   Neurological:  Lethargic   Skin: Skin is warm and dry. She is not diaphoretic.     ASSESSMENT/ PLAN:  1. Left femur fracture: will continue therapy as directed and follow up with ortho  as directed. Will continue tylenol 500 mg three times daily for pain management and ultram 50 mg every 6 hours as needed.   2. Anxiety: due to her lethargy will lower her ativan to 0.25 mg every 12 hours as needed and will monitor her status   3. Alzheimer's disease: no change in her status; she is lethargic today; will lower her depakote to 250 mg nightly; will continue to monitor her emotional status will continue asa 81 mg daily  4. Malnutrition: will continue supplements as directed will continue to monitor her weight and will continue to address in the future as indicated.   Will check cbc in one week.   Time spent with patient 50 minutes.

## 2013-10-25 ENCOUNTER — Non-Acute Institutional Stay (SKILLED_NURSING_FACILITY): Payer: Medicare Other | Admitting: Internal Medicine

## 2013-10-25 DIAGNOSIS — I359 Nonrheumatic aortic valve disorder, unspecified: Secondary | ICD-10-CM

## 2013-10-25 DIAGNOSIS — I350 Nonrheumatic aortic (valve) stenosis: Secondary | ICD-10-CM

## 2013-10-25 DIAGNOSIS — E43 Unspecified severe protein-calorie malnutrition: Secondary | ICD-10-CM

## 2013-10-25 DIAGNOSIS — F411 Generalized anxiety disorder: Secondary | ICD-10-CM

## 2013-10-25 DIAGNOSIS — F0280 Dementia in other diseases classified elsewhere without behavioral disturbance: Secondary | ICD-10-CM

## 2013-10-25 DIAGNOSIS — S72002P Fracture of unspecified part of neck of left femur, subsequent encounter for closed fracture with malunion: Secondary | ICD-10-CM

## 2013-10-25 DIAGNOSIS — IMO0002 Reserved for concepts with insufficient information to code with codable children: Secondary | ICD-10-CM

## 2013-10-25 DIAGNOSIS — F419 Anxiety disorder, unspecified: Secondary | ICD-10-CM

## 2013-10-31 NOTE — Progress Notes (Signed)
Patient ID: Anne [name], female   DOB: 10/31/1916, 79 y.o.   MRN: [id number]    [facility]    PCP: Anne Baars, MD  No Known Allergies  Chief Complaint: new admit  HPI:  79 y/o female patient is here for STR after hospital admission with left femoral neck fracture. She underwent left hip arthroplasty. She also has severe aortic stenosis and is not a surgical candidate. She is weak and feels tired. She is in no acute distress. She has been working with therapy team. Denies any complaints  Review of Systems  Constitutional: Negative for fever, chills, weight loss and diaphoresis.  HENT: Negative for congestion, hearing loss and sore throat.   Eyes: Negative for blurred vision, double vision and discharge.  Respiratory: Negative for cough, sputum production, shortness of breath and wheezing.   Cardiovascular: Negative for chest pain, palpitations and leg swelling.  Gastrointestinal: Negative for heartburn, nausea, vomiting, abdominal pain, diarrhea and constipation.  Genitourinary: Negative for dysuria, urgency, frequency and flank pain.  Musculoskeletal: Negative for back pain, falls, joint pain and myalgias.  Skin: Negative for itching and rash.  Neurological: Positive for weakness. Negative for dizziness, tingling, focal weakness and headaches.  Psychiatric/Behavioral: Negative for depression and memory loss. The patient is not nervous/anxious.     Past Medical History  Diagnosis Date  . Diabetes mellitus   . Dementia   . Thyroid disease   . Hypertension   . Murmur, heart 10/13/2013   Past Surgical History  Procedure Laterality Date  . Abdominal hysterectomy  1946    partial  . Hip arthroplasty Left 10/14/2013    Procedure: ARTHROPLASTY BIPOLAR HIP;  Surgeon: Anne Mustard, MD;  Location: [facility];  Service: Orthopedics;  Laterality: Left;   Social History:   reports that she has never smoked. She has never used smokeless tobacco. She reports that she does  not drink alcohol or use illicit drugs.  Family History  Problem Relation Age of Onset  . Stroke Mother   . Other Father     accident    Medications: Patient's Medications  New Prescriptions   No medications on file  Previous Medications   ACETAMINOPHEN (TYLENOL) 500 MG TABLET    Take 1 tablet (500 mg total) by mouth 3 (three) times daily.   ASPIRIN 81 MG CHEWABLE TABLET    Chew 1 tablet (81 mg total) by mouth daily.   DIVALPROEX (DEPAKOTE SPRINKLE) 125 MG CAPSULE    Take 2 capsules (250 mg total) by mouth at bedtime.   LACTOSE FREE NUTRITION (BOOST PLUS) LIQD    Take 237 mLs by mouth 3 (three) times daily with meals.   LORAZEPAM (ATIVAN) 0.5 MG TABLET    Take 0.5 tablets (0.25 mg total) by mouth 2 (two) times daily as needed.   TRAMADOL (ULTRAM) 50 MG TABLET    Take one tablet by mouth every 6 hours as needed  Modified Medications   No medications on file  Discontinued Medications   No medications on file     Physical Exam: Filed Vitals:   10/31/13 1303  BP: 132/74  Pulse: 84  Temp: 98.6 F (37 C)  Resp: 19  SpO2: 95%   Constitutional: No distress. Is frail and malnourished   Neck: Neck supple. No JVD present.  Cardiovascular: Normal rate, regular rhythm and intact distal pulses. Murmur present    Respiratory: Effort normal and breath sounds normal. No respiratory distress. She has no wheezes.  GI: Soft. Bowel  sounds are normal. She exhibits no distension. There is no tenderness.  Musculoskeletal: She exhibits no edema. Is able to move extremities. Staples in place in left hip area, dressing clean and dry. Generalized weakness noted Neurological: alert and oriented  Labs reviewed: Basic Metabolic Panel:  Recent Labs  16/10/96 0543 10/15/13 0515 10/16/13 0555  NA 136 132* 134*  K 3.6 3.6 3.2*  CL 104 99 101  CO2 21 21 20   GLUCOSE 172* 224* 177*  BUN 27* 20 20  CREATININE 0.87 0.87 0.80  CALCIUM 8.8 8.7 8.5   Liver Function Tests:  Recent Labs   04/03/13 0940 10/13/13 1631  AST 21 17  ALT 13 12  ALKPHOS 98 88  BILITOT 0.4 0.4  PROT 7.6 7.1  ALBUMIN 3.6 3.4*   No results found for this basename: LIPASE, AMYLASE,  in the last 8760 hours No results found for this basename: AMMONIA,  in the last 8760 hours CBC:  Recent Labs  10/15/13 0515 10/16/13 0555 10/16/13 1414  WBC 6.7 7.1 7.7  HGB 9.9* 8.5* 9.3*  HCT 30.2* 25.8* 27.5*  MCV 82.1 81.4 80.6  PLT 155 147* 165   Cardiac Enzymes:  Recent Labs  04/03/13 0940  TROPONINI <0.30   BNP: No components found with this basename: POCBNP,  CBG:  Recent Labs  10/16/13 0756 10/16/13 1153 10/16/13 1736  GLUCAP 181* 177* 220*    Radiological Exams: 10-13-13: chest x-ray: No active disease.  No diagnostic pneumothorax.  10-13-13: pelvic x-ray: Left femoral neck fracture, as described above.   10-13-13: left femur x-ray: Previously described left femoral neck fracture. Diffuse osteopenia.  10-13-13: ct of head: No acute intracranial abnormality. Stable atrophy and chronic white matter disease. Again noted small lacunar infarct in left basal ganglia.  10-15-13; 2-d echo: Left ventricle: Wall thickness was increased in a pattern of moderate LVH. Systolic function was normal. The estimated ejection fraction was in the range of 50% to 55%. The study is not technically sufficient to allow evaluation of LV diastolic function.- Aortic valve: There was severe stenosis. - Mitral valve: Calcified annulus. Moderately thickened leaflets .   Assessment/Plan  Left femur fracture- s/p left hip arthroplasty. Will continue to work with PT and OT. Fall precautions. Continue tylenol 500 mg three times daily with ultram 50 mg every 6 hours as needed. Continue aspirin for dvt prophylaxis  Alzheimer's disease- continue depakote 250 mg nightly and monitor clinically. No behavior changes  Anxiety- continue ativan 0.25 mg every 12 hours as needed and will monitor her status   Malnutrition-  will continue supplements and encourage po intake. Encourage OOB to chair  Severe AS- monitor clinically for worsening dyspnea, chest pain.    Family/ staff Communication: reviewed care plan with patient and nursing supervisor   Goals of care: likely will be a long term resident here   Labs/tests ordered- cbc, bmp

## 2013-11-05 ENCOUNTER — Encounter: Payer: Self-pay | Admitting: Adult Health

## 2013-11-05 ENCOUNTER — Non-Acute Institutional Stay (SKILLED_NURSING_FACILITY): Payer: Medicare Other | Admitting: Adult Health

## 2013-11-05 DIAGNOSIS — L02419 Cutaneous abscess of limb, unspecified: Secondary | ICD-10-CM

## 2013-11-05 DIAGNOSIS — L02416 Cutaneous abscess of left lower limb: Secondary | ICD-10-CM

## 2013-11-05 NOTE — Progress Notes (Signed)
Patient ID: Anne [name], female   DOB: 1916-10-01, 79 y.o.   MRN: [id number]     [facility]  No Known Allergies   Chief Complaint  Patient presents with  . Acute Visit    left hip incision     HPI:  I have been asked to see her regarding her left hip incision line. The area is red hot inflamed. The area is swollen with what feels like an abscess present. There is no bruising present. She cannot participate in the hpi or ros; however; she does have pain with palpation. There are no reports of fever present. Her x-ray of the left hip does not demonstrate anything irregular.   Past Medical History  Diagnosis Date  . Diabetes mellitus   . Dementia   . Thyroid disease   . Hypertension   . Murmur, heart 10/13/2013    Past Surgical History  Procedure Laterality Date  . Abdominal hysterectomy  1946    partial  . Hip arthroplasty Left 10/14/2013    Procedure: ARTHROPLASTY BIPOLAR HIP;  Surgeon: Nadara Mustard, MD;  Location: [facility];  Service: Orthopedics;  Laterality: Left;    VITAL SIGNS BP 128/72  Pulse 82  Ht 5\' 3"  (1.6 m)  Wt 92 lb 9.6 oz (42.003 kg)  BMI 16.41 kg/m2   Patient's Medications  New Prescriptions   No medications on file  Previous Medications   ACETAMINOPHEN (TYLENOL) 500 MG TABLET    Take 1 tablet (500 mg total) by mouth 3 (three) times daily.   ASPIRIN 81 MG CHEWABLE TABLET    Chew 1 tablet (81 mg total) by mouth daily.   DIVALPROEX (DEPAKOTE SPRINKLE) 125 MG CAPSULE    Take 2 capsules (250 mg total) by mouth at bedtime.   LACTOSE FREE NUTRITION (BOOST PLUS) LIQD    Take 237 mLs by mouth 3 (three) times daily with meals.   LORAZEPAM (ATIVAN) 0.5 MG TABLET    Take 0.5 tablets (0.25 mg total) by mouth 2 (two) times daily as needed.   TRAMADOL (ULTRAM) 50 MG TABLET    Take one tablet by mouth every 6 hours as needed  Modified Medications   No medications on file  Discontinued Medications   No medications on file    SIGNIFICANT DIAGNOSTIC  EXAMS  10-13-13: chest x-ray: No active disease.  No diagnostic pneumothorax.  10-13-13: pelvic x-ray: Left femoral neck fracture, as described above.   10-13-13: left femur x-ray: Previously described left femoral neck fracture. Diffuse osteopenia.  10-13-13: ct of head: No acute intracranial abnormality. Stable atrophy and chronic white matter disease. Again noted small lacunar infarct in left basal ganglia.  10-15-13; 2-d echo: Left ventricle: Wall thickness was increased in a pattern of moderate LVH. Systolic function was normal. The estimated ejection fraction was in the range of 50% to 55%. The study is not technically sufficient to allow evaluation of LV diastolic function.- Aortic valve: There was severe stenosis. - Mitral valve: Calcified annulus. Moderately thickened leaflets .  10-26-13: left hip x-ray: mild to moderate diffuse osteopenia; left hip prosthesis without malalignment or acute fracture; no lytic destructive lesion.     LABS REVIEWED:   10-13-13: wbc 10.9; hgb 11.9; hct 36.0 mcv 82.2; plt 217; glucose 276; bun 32; creat 1.07; k+3.7; na++136; liver normal albumin 3.4; tsh 4.152;hgb a1c 6.7; depakote 20.5 10-16-13; folate 10.5; ferritin 119; iron <10; vit b12: 646      Review of Systems  Unable to perform ROS  Physical Exam  Constitutional: No distress.  Frail; malnourished   Neck: Neck supple. No JVD present.  Cardiovascular: Normal rate, regular rhythm and intact distal pulses.   Murmur heard. Respiratory: Effort normal and breath sounds normal. No respiratory distress. She has no wheezes.  GI: Soft. Bowel sounds are normal. She exhibits no distension. There is no tenderness.  Musculoskeletal: She exhibits no edema.  Is able to move extremities   Neurological:  Lethargic   Skin: Skin is warm and dry. She is not diaphoretic left hip incision line is red inflamed hot; there is swollen area present that feels like an abscess .      ASSESSMENT/ PLAN:   1.  Left hip abscess: will begin ancef 500 mg im every 8 hours for 2 weeks with florastor twice daily for 2 weeks; will have her return back to orthopedics ASAP for follow up for possible abscess. And will continue to monitor her status   Time spent with patient 40 minutes.

## 2013-11-23 ENCOUNTER — Non-Acute Institutional Stay (SKILLED_NURSING_FACILITY): Payer: Medicare HMO | Admitting: Adult Health

## 2013-11-23 DIAGNOSIS — I350 Nonrheumatic aortic (valve) stenosis: Secondary | ICD-10-CM

## 2013-11-23 DIAGNOSIS — E43 Unspecified severe protein-calorie malnutrition: Secondary | ICD-10-CM

## 2013-11-23 DIAGNOSIS — F0280 Dementia in other diseases classified elsewhere without behavioral disturbance: Secondary | ICD-10-CM

## 2013-11-23 DIAGNOSIS — S72009A Fracture of unspecified part of neck of unspecified femur, initial encounter for closed fracture: Secondary | ICD-10-CM

## 2013-11-23 DIAGNOSIS — F419 Anxiety disorder, unspecified: Secondary | ICD-10-CM

## 2013-11-23 DIAGNOSIS — S72002A Fracture of unspecified part of neck of left femur, initial encounter for closed fracture: Secondary | ICD-10-CM

## 2013-11-23 DIAGNOSIS — I359 Nonrheumatic aortic valve disorder, unspecified: Secondary | ICD-10-CM

## 2013-11-23 DIAGNOSIS — F411 Generalized anxiety disorder: Secondary | ICD-10-CM

## 2013-11-23 DIAGNOSIS — G309 Alzheimer's disease, unspecified: Secondary | ICD-10-CM

## 2013-11-24 ENCOUNTER — Encounter: Payer: Self-pay | Admitting: Adult Health

## 2013-11-24 NOTE — Progress Notes (Signed)
Patient ID: Anne [name], female   DOB: Mar 14, 1916, 80 y.o.   MRN: [id number]     [facility]  No Known Allergies   Chief Complaint  Patient presents with  . Medical Managment of Chronic Issues    HPI:  She is being seen for the management of her chronic illnesses. There are no concerns from the nursing staff at this time. She is unable to fully participate in the hpi or ros. She has completed her abt for her left hip abscess there have been no further complications.     Past Medical History  Diagnosis Date  . Diabetes mellitus   . Dementia   . Thyroid disease   . Hypertension   . Murmur, heart 10/13/2013    Past Surgical History  Procedure Laterality Date  . Abdominal hysterectomy  1946    partial  . Hip arthroplasty Left 10/14/2013    Procedure: ARTHROPLASTY BIPOLAR HIP;  Surgeon: Nadara MustardMarcus V Duda, MD;  Location: [facility];  Service: Orthopedics;  Laterality: Left;    VITAL SIGNS BP 140/76  Pulse 76  Ht 5\' 3"  (1.6 m)  Wt 90 lb 12.8 oz (41.187 kg)  BMI 16.09 kg/m2   Patient's Medications  New Prescriptions   No medications on file  Previous Medications   ACETAMINOPHEN (TYLENOL) 500 MG TABLET    Take 1 tablet (500 mg total) by mouth 3 (three) times daily.   ASPIRIN 81 MG CHEWABLE TABLET    Chew 1 tablet (81 mg total) by mouth daily.   DIVALPROEX (DEPAKOTE SPRINKLE) 125 MG CAPSULE    Take 2 capsules (250 mg total) by mouth at bedtime.   LACTOSE FREE NUTRITION (BOOST PLUS) LIQD    Take 237 mLs by mouth 3 (three) times daily with meals.   LORAZEPAM (ATIVAN) 0.5 MG TABLET    Take 0.5 tablets (0.25 mg total) by mouth 2 (two) times daily as needed.   TRAMADOL (ULTRAM) 50 MG TABLET    Take one tablet by mouth every 6 hours as needed  Modified Medications   No medications on file  Discontinued Medications   No medications on file    SIGNIFICANT DIAGNOSTIC EXAMS  10-13-13: chest x-ray: No active disease.  No diagnostic pneumothorax.  10-13-13: pelvic x-ray: Left  femoral neck fracture, as described above.   10-13-13: left femur x-ray: Previously described left femoral neck fracture. Diffuse osteopenia.  10-13-13: ct of head: No acute intracranial abnormality. Stable atrophy and chronic white matter disease. Again noted small lacunar infarct in left basal ganglia.  10-15-13; 2-d echo: Left ventricle: Wall thickness was increased in a pattern of moderate LVH. Systolic function was normal. The estimated ejection fraction was in the range of 50% to 55%. The study is not technically sufficient to allow evaluation of LV diastolic function.- Aortic valve: There was severe stenosis. - Mitral valve: Calcified annulus. Moderately thickened leaflets .  10-26-13: left hip x-ray: mild to moderate diffuse osteopenia; left hip prosthesis without malalignment or acute fracture; no lytic destructive lesion.    11-05-13: left hip x-ray: mild to moderate osteopenia left hip prosthesis with malalignment or acute fracture. No lytic destructive lesion     LABS REVIEWED:   10-13-13: wbc 10.9; hgb 11.9; hct 36.0 mcv 82.2; plt 217; glucose 276; bun 32; creat 1.07; k+3.7; na++136; liver normal albumin 3.4; tsh 4.152;hgb a1c 6.7; depakote 20.5 10-16-13; folate 10.5; ferritin 119; iron <10; vit b12: 646 10-30-13: chol 118; ldl 53; trig 64; hgb a1c 6.2  Review of Systems  Unable to perform ROS   Physical Exam  Constitutional: No distress.  Frail; malnourished   Neck: Neck supple. No JVD present.  Cardiovascular: Normal rate, regular rhythm and intact distal pulses.   Murmur heard. Respiratory: Effort normal and breath sounds normal. No respiratory distress. She has no wheezes.  GI: Soft. Bowel sounds are normal. She exhibits no distension. There is no tenderness.  Musculoskeletal: She exhibits no edema.  Is able to move extremities   Neurological:  Lethargic   Skin: Skin is warm and dry. She is not diaphoretic left hip incision line is red inflamed hot; there is  swollen area present that feels like an abscess .     ASSESSMENT/ PLAN:   1. Left femur fracture: will continue therapy as directed and follow up with ortho as directed. Will continue tylenol 500 mg three times daily for pain management and ultram 50 mg every 6 hours as needed.   2. Anxiety: she is presently stable will continue ativan 0.25 mg twice daily as needed and will monitor   3. Alzheimer's disease: no change in her status; she is lethargic today; will continue depakote sprinkle 250 mg nightly  will continue to monitor her emotional status will continue asa 81 mg daily  4. Malnutrition: will continue supplements as directed will continue to monitor her weight and will continue to address in the future as indicated.

## 2013-11-27 ENCOUNTER — Non-Acute Institutional Stay (SKILLED_NURSING_FACILITY): Payer: Medicare HMO | Admitting: Adult Health

## 2013-11-27 DIAGNOSIS — R5381 Other malaise: Secondary | ICD-10-CM

## 2013-11-27 DIAGNOSIS — G309 Alzheimer's disease, unspecified: Secondary | ICD-10-CM

## 2013-11-27 DIAGNOSIS — S72009A Fracture of unspecified part of neck of unspecified femur, initial encounter for closed fracture: Secondary | ICD-10-CM

## 2013-11-27 DIAGNOSIS — E43 Unspecified severe protein-calorie malnutrition: Secondary | ICD-10-CM

## 2013-11-27 DIAGNOSIS — R531 Weakness: Secondary | ICD-10-CM

## 2013-11-27 DIAGNOSIS — R5383 Other fatigue: Secondary | ICD-10-CM

## 2013-11-27 DIAGNOSIS — F0280 Dementia in other diseases classified elsewhere without behavioral disturbance: Secondary | ICD-10-CM

## 2013-11-27 DIAGNOSIS — S72002A Fracture of unspecified part of neck of left femur, initial encounter for closed fracture: Secondary | ICD-10-CM

## 2013-12-02 ENCOUNTER — Encounter: Payer: Self-pay | Admitting: Adult Health

## 2013-12-02 NOTE — Progress Notes (Signed)
Patient ID: Anne [name], female   DOB: September 07, 1916, 80 y.o.   MRN: [id number]     [facility]  No Known Allergies   Chief Complaint  Patient presents with  . Discharge Note    HPI:  She is being discharged to home with hospice care she will continue pt at home. She will not need dme; hospice care will provide any necessary equipment. She will need prescriptions to be written. She had been hospitalized for a left hip fracture.   Past Medical History  Diagnosis Date  . Diabetes mellitus   . Dementia   . Thyroid disease   . Hypertension   . Murmur, heart 10/13/2013    Past Surgical History  Procedure Laterality Date  . Abdominal hysterectomy  1946    partial  . Hip arthroplasty Left 10/14/2013    Procedure: ARTHROPLASTY BIPOLAR HIP;  Surgeon: Nadara MustardMarcus V Duda, MD;  Location: [facility];  Service: Orthopedics;  Laterality: Left;    VITAL SIGNS BP 128/64  Pulse 70  Ht 5\' 3"  (1.6 m)  Wt 91 lb 4.8 oz (41.413 kg)  BMI 16.18 kg/m2   Patient's Medications  New Prescriptions   No medications on file  Previous Medications   ACETAMINOPHEN (TYLENOL) 500 MG TABLET    Take 1 tablet (500 mg total) by mouth 3 (three) times daily.   ASPIRIN 81 MG CHEWABLE TABLET    Chew 1 tablet (81 mg total) by mouth daily.   DIVALPROEX (DEPAKOTE SPRINKLE) 125 MG CAPSULE    Take 2 capsules (250 mg total) by mouth at bedtime.   LACTOSE FREE NUTRITION (BOOST PLUS) LIQD    Take 237 mLs by mouth 3 (three) times daily with meals.   LORAZEPAM (ATIVAN) 0.5 MG TABLET    Take 0.5 tablets (0.25 mg total) by mouth 2 (two) times daily as needed.   TRAMADOL (ULTRAM) 50 MG TABLET    Take one tablet by mouth every 6 hours as needed  Modified Medications   No medications on file  Discontinued Medications   No medications on file    SIGNIFICANT DIAGNOSTIC EXAMS  10-13-13: chest x-ray: No active disease.  No diagnostic pneumothorax.  10-13-13: pelvic x-ray: Left femoral neck fracture, as described above.     10-13-13: left femur x-ray: Previously described left femoral neck fracture. Diffuse osteopenia.  10-13-13: ct of head: No acute intracranial abnormality. Stable atrophy and chronic white matter disease. Again noted small lacunar infarct in left basal ganglia.  10-15-13; 2-d echo: Left ventricle: Wall thickness was increased in a pattern of moderate LVH. Systolic function was normal. The estimated ejection fraction was in the range of 50% to 55%. The study is not technically sufficient to allow evaluation of LV diastolic function.- Aortic valve: There was severe stenosis. - Mitral valve: Calcified annulus. Moderately thickened leaflets .  10-26-13: left hip x-ray: mild to moderate diffuse osteopenia; left hip prosthesis without malalignment or acute fracture; no lytic destructive lesion.    11-05-13: left hip x-ray: mild to moderate osteopenia left hip prosthesis with malalignment or acute fracture. No lytic destructive lesion     LABS REVIEWED:   10-13-13: wbc 10.9; hgb 11.9; hct 36.0 mcv 82.2; plt 217; glucose 276; bun 32; creat 1.07; k+3.7; na++136; liver normal albumin 3.4; tsh 4.152;hgb a1c 6.7; depakote 20.5 10-16-13; folate 10.5; ferritin 119; iron <10; vit b12: 646 10-30-13: chol 118; ldl 53; trig 64; hgb a1c 6.2       Review of Systems  Unable to perform  ROS   Physical Exam  Constitutional: No distress.  Frail; malnourished   Neck: Neck supple. No JVD present.  Cardiovascular: Normal rate, regular rhythm and intact distal pulses.   Murmur heard. Respiratory: Effort normal and breath sounds normal. No respiratory distress. She has no wheezes.  GI: Soft. Bowel sounds are normal. She exhibits no distension. There is no tenderness.  Musculoskeletal: She exhibits no edema.  Is able to move extremities   Neurological: alert   Skin: Skin is warm and dry. She is not diaphoretic .     ASSESSMENT/ PLAN:  She is being discharged to home with hospice care; she will continue pt at  home. Will not need dme. Her prescriptions have been written.   Time spent with patient 35 minutes.   FUTURE ORDERS:

## 2014-01-08 ENCOUNTER — Emergency Department (HOSPITAL_COMMUNITY)
Admission: EM | Admit: 2014-01-08 | Discharge: 2014-01-08 | Disposition: A | Discharge status: Home / Self Care | Payer: Medicare HMO | Attending: Emergency Medicine | Admitting: Emergency Medicine

## 2014-01-08 ENCOUNTER — Encounter (HOSPITAL_COMMUNITY): Payer: Self-pay | Admitting: Emergency Medicine

## 2014-01-08 DIAGNOSIS — R011 Cardiac murmur, unspecified: Secondary | ICD-10-CM | POA: Insufficient documentation

## 2014-01-08 DIAGNOSIS — N390 Urinary tract infection, site not specified: Secondary | ICD-10-CM | POA: Insufficient documentation

## 2014-01-08 DIAGNOSIS — Z7982 Long term (current) use of aspirin: Secondary | ICD-10-CM | POA: Insufficient documentation

## 2014-01-08 DIAGNOSIS — Z79899 Other long term (current) drug therapy: Secondary | ICD-10-CM | POA: Insufficient documentation

## 2014-01-08 DIAGNOSIS — R739 Hyperglycemia, unspecified: Secondary | ICD-10-CM

## 2014-01-08 DIAGNOSIS — I10 Essential (primary) hypertension: Secondary | ICD-10-CM | POA: Insufficient documentation

## 2014-01-08 DIAGNOSIS — E119 Type 2 diabetes mellitus without complications: Secondary | ICD-10-CM | POA: Insufficient documentation

## 2014-01-08 DIAGNOSIS — F0390 Unspecified dementia without behavioral disturbance: Secondary | ICD-10-CM | POA: Insufficient documentation

## 2014-01-08 LAB — CBG MONITORING, ED
GLUCOSE-CAPILLARY: 187 mg/dL — AB (ref 70–99)
Glucose-Capillary: 296 mg/dL — ABNORMAL HIGH (ref 70–99)

## 2014-01-08 LAB — CBC WITH DIFFERENTIAL/PLATELET
BASOS ABS: 0 10*3/uL (ref 0.0–0.1)
Basophils Relative: 0 % (ref 0–1)
Eosinophils Absolute: 0.2 10*3/uL (ref 0.0–0.7)
Eosinophils Relative: 2 % (ref 0–5)
HCT: 30 % — ABNORMAL LOW (ref 36.0–46.0)
Hemoglobin: 9.6 g/dL — ABNORMAL LOW (ref 12.0–15.0)
LYMPHS PCT: 19 % (ref 12–46)
Lymphs Abs: 1.6 10*3/uL (ref 0.7–4.0)
MCH: 25.9 pg — ABNORMAL LOW (ref 26.0–34.0)
MCHC: 32 g/dL (ref 30.0–36.0)
MCV: 80.9 fL (ref 78.0–100.0)
Monocytes Absolute: 0.6 10*3/uL (ref 0.1–1.0)
Monocytes Relative: 7 % (ref 3–12)
NEUTROS ABS: 5.8 10*3/uL (ref 1.7–7.7)
NEUTROS PCT: 71 % (ref 43–77)
PLATELETS: 266 10*3/uL (ref 150–400)
RBC: 3.71 MIL/uL — ABNORMAL LOW (ref 3.87–5.11)
RDW: 13.8 % (ref 11.5–15.5)
WBC: 8.2 10*3/uL (ref 4.0–10.5)

## 2014-01-08 LAB — BASIC METABOLIC PANEL
BUN: 42 mg/dL — AB (ref 6–23)
CALCIUM: 8.9 mg/dL (ref 8.4–10.5)
CO2: 23 meq/L (ref 19–32)
CREATININE: 0.91 mg/dL (ref 0.50–1.10)
Chloride: 99 mEq/L (ref 96–112)
GFR calc Af Amer: 60 mL/min — ABNORMAL LOW (ref >90)
GFR calc non Af Amer: 52 mL/min — ABNORMAL LOW (ref >90)
Glucose, Bld: 279 mg/dL — ABNORMAL HIGH (ref 70–99)
Potassium: 4.3 mEq/L (ref 3.7–5.3)
Sodium: 135 mEq/L — ABNORMAL LOW (ref 137–147)

## 2014-01-08 LAB — URINE MICROSCOPIC-ADD ON

## 2014-01-08 LAB — URINALYSIS, ROUTINE W REFLEX MICROSCOPIC
BILIRUBIN URINE: NEGATIVE
GLUCOSE, UA: 100 mg/dL — AB
KETONES UR: NEGATIVE mg/dL
Nitrite: NEGATIVE
PROTEIN: NEGATIVE mg/dL
Specific Gravity, Urine: 1.015 (ref 1.005–1.030)
Urobilinogen, UA: 0.2 mg/dL (ref 0.0–1.0)
pH: 6.5 (ref 5.0–8.0)

## 2014-01-08 MED ORDER — CEPHALEXIN 500 MG PO CAPS: 500.0000 mg | ORAL_CAPSULE | Freq: Four times a day (QID) | ORAL | Status: DC

## 2014-01-08 MED ORDER — SODIUM CHLORIDE 0.9 % IV BOLUS (SEPSIS)
500.0000 mL | Freq: Once | INTRAVENOUS | Status: AC
  Administered 2014-01-08: 500 mL via INTRAVENOUS

## 2014-01-08 MED ORDER — SODIUM CHLORIDE 0.9 % IV SOLN
INTRAVENOUS | Status: DC
  Administered 2014-01-08: 14:00:00 via INTRAVENOUS

## 2014-01-08 MED ORDER — METFORMIN HCL 500 MG PO TABS: 500.0000 mg | ORAL_TABLET | Freq: Every evening | ORAL | Status: DC

## 2014-01-08 NOTE — ED Notes (Signed)
Anne FolksBilly [name] [phone]

## 2014-01-08 NOTE — ED Notes (Signed)
Spoke with patient's son who is on his way to pick up his mother.

## 2014-01-08 NOTE — ED Notes (Signed)
Anne PilgrimJacob and I did the in and out cath

## 2014-01-08 NOTE — ED Notes (Signed)
Patient's son at bedside.

## 2014-01-08 NOTE — ED Provider Notes (Signed)
CSN: [id number]     Arrival date & time 01/08/14  1231 History   First MD Initiated Contact with Patient 01/08/14 1307     Chief Complaint  Patient presents with  . Hyperglycemia     (Consider location/radiation/quality/duration/timing/severity/associated sxs/prior Treatment) Patient is a 80 y.o. female presenting with hyperglycemia. The history is provided by a relative. The history is limited by the condition of the patient.  Hyperglycemia  patient is here with hyperglycemia up to 400 home. Patient has a history of dementia. Patient had been on metformin in the past but was taken off the DT decreased oral intake. She has since had a good appetite. No recent illnesses such as vomiting or diarrhea. No fever or chills. EMS was called and the patient's blood sugar was 466 and patient was transported here. No treatment used prior to arrival. Nothing makes her symptoms better or worse.  Past Medical History  Diagnosis Date  . Diabetes mellitus   . Dementia   . Thyroid disease   . Hypertension   . Murmur, heart 10/13/2013   Past Surgical History  Procedure Laterality Date  . Abdominal hysterectomy  1946    partial  . Hip arthroplasty Left 10/14/2013    Procedure: ARTHROPLASTY BIPOLAR HIP;  Surgeon: Nadara MustardMarcus V Duda, MD;  Location: [facility];  Service: Orthopedics;  Laterality: Left;   Family History  Problem Relation Age of Onset  . Stroke Mother   . Other Father     accident   History  Substance Use Topics  . Smoking status: Never Smoker   . Smokeless tobacco: Never Used  . Alcohol Use: No   OB History   Grav Para Term Preterm Abortions TAB SAB Ect Mult Living                 Review of Systems  Unable to perform ROS     Allergies  Review of patient's allergies indicates no known allergies.  Home Medications   Current Outpatient Rx  Name  Route  Sig  Dispense  Refill  . divalproex (DEPAKOTE SPRINKLE) 125 MG capsule   Oral   Take 2 capsules (250 mg total) by mouth at  bedtime.   60 capsule   11   . traMADol (ULTRAM) 50 MG tablet   Oral   Take 50 mg by mouth every 6 (six) hours as needed for moderate pain.         Marland Kitchen. acetaminophen (TYLENOL) 500 MG tablet   Oral   Take 1 tablet (500 mg total) by mouth 3 (three) times daily.   30 tablet   0   . aspirin 81 MG chewable tablet   Oral   Chew 1 tablet (81 mg total) by mouth daily.         Marland Kitchen. lactose free nutrition (BOOST PLUS) LIQD   Oral   Take 237 mLs by mouth 3 (three) times daily with meals.      0   . LORazepam (ATIVAN) 0.5 MG tablet   Oral   Take 0.5 tablets (0.25 mg total) by mouth 2 (two) times daily as needed.   30 tablet   5    BP 132/61  Pulse 95  Temp(Src) 97.6 F (36.4 C) (Oral)  Resp 26  SpO2 95% Physical Exam  Nursing note and vitals reviewed. Constitutional: She appears well-developed and well-nourished.  Non-toxic appearance. No distress.  HENT:  Head: Normocephalic and atraumatic.  Eyes: Conjunctivae, EOM and lids are normal. Pupils are equal, round,  and reactive to light.  Neck: Normal range of motion. Neck supple. No tracheal deviation present. No mass present.  Cardiovascular: Normal rate, regular rhythm and normal heart sounds.  Exam reveals no gallop.   No murmur heard. Pulmonary/Chest: Effort normal and breath sounds normal. No stridor. No respiratory distress. She has no decreased breath sounds. She has no wheezes. She has no rhonchi. She has no rales.  Abdominal: Soft. Normal appearance and bowel sounds are normal. She exhibits no distension. There is no tenderness. There is no rebound and no CVA tenderness.  Musculoskeletal: Normal range of motion. She exhibits no edema and no tenderness.  Neurological: She is alert. No cranial nerve deficit or sensory deficit. GCS eye subscore is 4. GCS verbal subscore is 4. GCS motor subscore is 6.  Skin: Skin is warm and dry. No abrasion and no rash noted.  Psychiatric: Her affect is blunt. She is withdrawn.    ED  Course  Procedures (including critical care time) Labs Review Labs Reviewed  CBG MONITORING, ED - Abnormal; Notable for the following:    Glucose-Capillary 296 (*)    All other components within normal limits  BASIC METABOLIC PANEL   Imaging Review No results found.   EKG Interpretation None      MDM   Final diagnoses:  None    Patient is elevated blood sugar treated here with IV fluids and repeat CBG is 187. Patient does have a urinary tract infection and will be placed on antibiotics for that as well as will be restarted on her Glucophage.    Toy Baker, MD 01/08/14 [phone]

## 2014-01-08 NOTE — Discharge Instructions (Signed)
Urinary Tract Infection °Urinary tract infections (UTIs) can develop anywhere along your urinary tract. Your urinary tract is your body's drainage system for removing wastes and extra water. Your urinary tract includes two kidneys, two ureters, a bladder, and a urethra. Your kidneys are a pair of bean-shaped organs. Each kidney is about the size of your fist. They are located below your ribs, one on each side of your spine. °CAUSES °Infections are caused by microbes, which are microscopic organisms, including fungi, viruses, and bacteria. These organisms are so small that they can only be seen through a microscope. Bacteria are the microbes that most commonly cause UTIs. °SYMPTOMS  °Symptoms of UTIs may vary by age and gender of the patient and by the location of the infection. Symptoms in young women typically include a frequent and intense urge to urinate and a painful, burning feeling in the bladder or urethra during urination. Older women and men are more likely to be tired, shaky, and weak and have muscle aches and abdominal pain. A fever may mean the infection is in your kidneys. Other symptoms of a kidney infection include pain in your back or sides below the ribs, nausea, and vomiting. °DIAGNOSIS °To diagnose a UTI, your caregiver will ask you about your symptoms. Your caregiver also will ask to provide a urine sample. The urine sample will be tested for bacteria and white blood cells. White blood cells are made by your body to help fight infection. °TREATMENT  °Typically, UTIs can be treated with medication. Because most UTIs are caused by a bacterial infection, they usually can be treated with the use of antibiotics. The choice of antibiotic and length of treatment depend on your symptoms and the type of bacteria causing your infection. °HOME CARE INSTRUCTIONS °· If you were prescribed antibiotics, take them exactly as your caregiver instructs you. Finish the medication even if you feel better after you  have only taken some of the medication. °· Drink enough water and fluids to keep your urine clear or pale yellow. °· Avoid caffeine, tea, and carbonated beverages. They tend to irritate your bladder. °· Empty your bladder often. Avoid holding urine for long periods of time. °· Empty your bladder before and after sexual intercourse. °· After a bowel movement, women should cleanse from front to back. Use each tissue only once. °SEEK MEDICAL CARE IF:  °· You have back pain. °· You develop a fever. °· Your symptoms do not begin to resolve within 3 days. °SEEK IMMEDIATE MEDICAL CARE IF:  °· You have severe back pain or lower abdominal pain. °· You develop chills. °· You have nausea or vomiting. °· You have continued burning or discomfort with urination. °MAKE SURE YOU:  °· Understand these instructions. °· Will watch your condition. °· Will get help right away if you are not doing well or get worse. °Document Released: 08/04/2005 Document Revised: 04/25/2012 Document Reviewed: 12/03/2011 °ExitCare® Patient Information ©2014 [employer]. ° °Urinary Tract Infection °Urinary tract infections (UTIs) can develop anywhere along your urinary tract. Your urinary tract is your body's drainage system for removing wastes and extra water. Your urinary tract includes two kidneys, two ureters, a bladder, and a urethra. Your kidneys are a pair of bean-shaped organs. Each kidney is about the size of your fist. They are located below your ribs, one on each side of your spine. °CAUSES °Infections are caused by microbes, which are microscopic organisms, including fungi, viruses, and bacteria. These organisms are so small that they can only   be seen through a microscope. Bacteria are the microbes that most commonly cause UTIs. °SYMPTOMS  °Symptoms of UTIs may vary by age and gender of the patient and by the location of the infection. Symptoms in young women typically include a frequent and intense urge to urinate and a painful, burning  feeling in the bladder or urethra during urination. Older women and men are more likely to be tired, shaky, and weak and have muscle aches and abdominal pain. A fever may mean the infection is in your kidneys. Other symptoms of a kidney infection include pain in your back or sides below the ribs, nausea, and vomiting. °DIAGNOSIS °To diagnose a UTI, your caregiver will ask you about your symptoms. Your caregiver also will ask to provide a urine sample. The urine sample will be tested for bacteria and white blood cells. White blood cells are made by your body to help fight infection. °TREATMENT  °Typically, UTIs can be treated with medication. Because most UTIs are caused by a bacterial infection, they usually can be treated with the use of antibiotics. The choice of antibiotic and length of treatment depend on your symptoms and the type of bacteria causing your infection. °HOME CARE INSTRUCTIONS °· If you were prescribed antibiotics, take them exactly as your caregiver instructs you. Finish the medication even if you feel better after you have only taken some of the medication. °· Drink enough water and fluids to keep your urine clear or pale yellow. °· Avoid caffeine, tea, and carbonated beverages. They tend to irritate your bladder. °· Empty your bladder often. Avoid holding urine for long periods of time. °· Empty your bladder before and after sexual intercourse. °· After a bowel movement, women should cleanse from front to back. Use each tissue only once. °SEEK MEDICAL CARE IF:  °· You have back pain. °· You develop a fever. °· Your symptoms do not begin to resolve within 3 days. °SEEK IMMEDIATE MEDICAL CARE IF:  °· You have severe back pain or lower abdominal pain. °· You develop chills. °· You have nausea or vomiting. °· You have continued burning or discomfort with urination. °MAKE SURE YOU:  °· Understand these instructions. °· Will watch your condition. °· Will get help right away if you are not doing well  or get worse. °Document Released: 08/04/2005 Document Revised: 04/25/2012 Document Reviewed: 12/03/2011 °ExitCare® Patient Information ©2014 [employer]. ° °

## 2014-01-08 NOTE — ED Notes (Signed)
Patient presents from home via EMS with a chief complaint of hyperglycemia. Patient is usually cared for by her husband (who is currently in the hospital), caregiver today noted her CBG to be over 400. EMS reports CBG to be 466. Patient has a history of Alzheimers and is normal per her baseline.

## 2014-01-08 NOTE — ED Notes (Signed)
Bed: WA19 Expected date:  Expected time:  Means of arrival:  Comments: EMS-hypoglycemia 

## 2014-01-10 LAB — URINE CULTURE: Colony Count: >=100000

## 2014-01-12 ENCOUNTER — Telehealth (HOSPITAL_BASED_OUTPATIENT_CLINIC_OR_DEPARTMENT_OTHER): Payer: Self-pay | Admitting: Emergency Medicine

## 2014-01-12 NOTE — Telephone Encounter (Signed)
Post ED Visit - Positive Culture Follow-up  Culture report reviewed by antimicrobial stewardship pharmacist: [x]  Wes Dulaney, Pharm.D., BCPS []  Celedonio MiyamotoJeremy Frens, [address] BCPS []  Georgina PillionElizabeth Martin, [address] BCPS []  EddingtonMinh Pham, [place] BCPS, AAHIVP []  Estella HuskMichelle Turner, Pharm.D., BCPS, AAHIVP  Positive urine culture Treated with Keflex, organism sensitive to the same and no further patient follow-up is required at this time.  MullinsHolland, Jenel LucksKylie 01/12/2014, 12:09 PM

## 2014-01-26 ENCOUNTER — Emergency Department (HOSPITAL_COMMUNITY)
Admission: EM | Admit: 2014-01-26 | Discharge: 2014-01-26 | Disposition: A | Discharge status: Home / Self Care | Payer: Medicare HMO | Attending: Emergency Medicine | Admitting: Emergency Medicine

## 2014-01-26 ENCOUNTER — Emergency Department (HOSPITAL_COMMUNITY): Payer: Medicare HMO

## 2014-01-26 ENCOUNTER — Other Ambulatory Visit: Payer: Self-pay | Admitting: Adult Health

## 2014-01-26 ENCOUNTER — Encounter (HOSPITAL_COMMUNITY): Payer: Self-pay | Admitting: Emergency Medicine

## 2014-01-26 ENCOUNTER — Inpatient Hospital Stay (HOSPITAL_COMMUNITY)
Admission: EM | Admit: 2014-01-26 | Discharge: 2014-01-28 | DRG: 689 | Disposition: A | Discharge status: Hospice | Payer: Medicare HMO | Attending: Internal Medicine | Admitting: Internal Medicine

## 2014-01-26 DIAGNOSIS — Z79899 Other long term (current) drug therapy: Secondary | ICD-10-CM | POA: Insufficient documentation

## 2014-01-26 DIAGNOSIS — F0280 Dementia in other diseases classified elsewhere without behavioral disturbance: Secondary | ICD-10-CM | POA: Diagnosis present

## 2014-01-26 DIAGNOSIS — R944 Abnormal results of kidney function studies: Secondary | ICD-10-CM | POA: Insufficient documentation

## 2014-01-26 DIAGNOSIS — R197 Diarrhea, unspecified: Secondary | ICD-10-CM | POA: Insufficient documentation

## 2014-01-26 DIAGNOSIS — R5381 Other malaise: Secondary | ICD-10-CM | POA: Diagnosis present

## 2014-01-26 DIAGNOSIS — R011 Cardiac murmur, unspecified: Secondary | ICD-10-CM | POA: Insufficient documentation

## 2014-01-26 DIAGNOSIS — IMO0002 Reserved for concepts with insufficient information to code with codable children: Secondary | ICD-10-CM | POA: Diagnosis present

## 2014-01-26 DIAGNOSIS — IMO0001 Reserved for inherently not codable concepts without codable children: Secondary | ICD-10-CM | POA: Diagnosis present

## 2014-01-26 DIAGNOSIS — R710 Precipitous drop in hematocrit: Secondary | ICD-10-CM | POA: Diagnosis not present

## 2014-01-26 DIAGNOSIS — E860 Dehydration: Secondary | ICD-10-CM

## 2014-01-26 DIAGNOSIS — M810 Age-related osteoporosis without current pathological fracture: Secondary | ICD-10-CM | POA: Diagnosis present

## 2014-01-26 DIAGNOSIS — E119 Type 2 diabetes mellitus without complications: Secondary | ICD-10-CM | POA: Diagnosis not present

## 2014-01-26 DIAGNOSIS — E039 Hypothyroidism, unspecified: Secondary | ICD-10-CM | POA: Diagnosis present

## 2014-01-26 DIAGNOSIS — R531 Weakness: Secondary | ICD-10-CM | POA: Diagnosis present

## 2014-01-26 DIAGNOSIS — I10 Essential (primary) hypertension: Secondary | ICD-10-CM | POA: Diagnosis present

## 2014-01-26 DIAGNOSIS — R1110 Vomiting, unspecified: Secondary | ICD-10-CM | POA: Insufficient documentation

## 2014-01-26 DIAGNOSIS — F0390 Unspecified dementia without behavioral disturbance: Secondary | ICD-10-CM | POA: Diagnosis not present

## 2014-01-26 DIAGNOSIS — E876 Hypokalemia: Secondary | ICD-10-CM | POA: Diagnosis present

## 2014-01-26 DIAGNOSIS — Z96649 Presence of unspecified artificial hip joint: Secondary | ICD-10-CM

## 2014-01-26 DIAGNOSIS — E43 Unspecified severe protein-calorie malnutrition: Secondary | ICD-10-CM | POA: Diagnosis present

## 2014-01-26 DIAGNOSIS — E785 Hyperlipidemia, unspecified: Secondary | ICD-10-CM | POA: Diagnosis present

## 2014-01-26 DIAGNOSIS — R5383 Other fatigue: Secondary | ICD-10-CM

## 2014-01-26 DIAGNOSIS — I959 Hypotension, unspecified: Secondary | ICD-10-CM | POA: Diagnosis present

## 2014-01-26 DIAGNOSIS — I359 Nonrheumatic aortic valve disorder, unspecified: Secondary | ICD-10-CM | POA: Diagnosis present

## 2014-01-26 DIAGNOSIS — N390 Urinary tract infection, site not specified: Secondary | ICD-10-CM

## 2014-01-26 DIAGNOSIS — B961 Klebsiella pneumoniae [K. pneumoniae] as the cause of diseases classified elsewhere: Secondary | ICD-10-CM | POA: Diagnosis present

## 2014-01-26 DIAGNOSIS — Z792 Long term (current) use of antibiotics: Secondary | ICD-10-CM | POA: Insufficient documentation

## 2014-01-26 DIAGNOSIS — E118 Type 2 diabetes mellitus with unspecified complications: Secondary | ICD-10-CM

## 2014-01-26 DIAGNOSIS — E559 Vitamin D deficiency, unspecified: Secondary | ICD-10-CM | POA: Diagnosis present

## 2014-01-26 DIAGNOSIS — Z66 Do not resuscitate: Secondary | ICD-10-CM | POA: Diagnosis present

## 2014-01-26 DIAGNOSIS — N179 Acute kidney failure, unspecified: Secondary | ICD-10-CM

## 2014-01-26 DIAGNOSIS — E1165 Type 2 diabetes mellitus with hyperglycemia: Secondary | ICD-10-CM | POA: Diagnosis present

## 2014-01-26 DIAGNOSIS — G309 Alzheimer's disease, unspecified: Secondary | ICD-10-CM | POA: Diagnosis present

## 2014-01-26 DIAGNOSIS — R799 Abnormal finding of blood chemistry, unspecified: Secondary | ICD-10-CM

## 2014-01-26 LAB — CBC WITH DIFFERENTIAL/PLATELET
Basophils Absolute: 0 10*3/uL (ref 0.0–0.1)
Basophils Absolute: 0 10*3/uL (ref 0.0–0.1)
Basophils Relative: 0 % (ref 0–1)
Basophils Relative: 0 % (ref 0–1)
EOS ABS: 0 10*3/uL (ref 0.0–0.7)
EOS PCT: 0 % (ref 0–5)
Eosinophils Absolute: 0 10*3/uL (ref 0.0–0.7)
Eosinophils Relative: 0 % (ref 0–5)
HEMATOCRIT: 35.2 % — AB (ref 36.0–46.0)
HEMATOCRIT: 33.6 % — AB (ref 36.0–46.0)
HEMOGLOBIN: 10.8 g/dL — AB (ref 12.0–15.0)
Hemoglobin: 11.3 g/dL — ABNORMAL LOW (ref 12.0–15.0)
LYMPHS ABS: 0.8 10*3/uL (ref 0.7–4.0)
LYMPHS PCT: 6 % — AB (ref 12–46)
Lymphocytes Relative: 18 % (ref 12–46)
Lymphs Abs: 0.9 10*3/uL (ref 0.7–4.0)
MCH: 25.1 pg — ABNORMAL LOW (ref 26.0–34.0)
MCH: 25.1 pg — AB (ref 26.0–34.0)
MCHC: 32.1 g/dL (ref 30.0–36.0)
MCHC: 32.1 g/dL (ref 30.0–36.0)
MCV: 78.2 fL (ref 78.0–100.0)
MCV: 78 fL (ref 78.0–100.0)
MONO ABS: 0.9 10*3/uL (ref 0.1–1.0)
MONOS PCT: 18 % — AB (ref 3–12)
Monocytes Absolute: 1.5 10*3/uL — ABNORMAL HIGH (ref 0.1–1.0)
Monocytes Relative: 11 % (ref 3–12)
NEUTROS ABS: 3.3 10*3/uL (ref 1.7–7.7)
Neutro Abs: 11.4 10*3/uL — ABNORMAL HIGH (ref 1.7–7.7)
Neutrophils Relative %: 84 % — ABNORMAL HIGH (ref 43–77)
Neutrophils Relative %: 65 % (ref 43–77)
Platelets: 288 10*3/uL (ref 150–400)
Platelets: 319 10*3/uL (ref 150–400)
RBC: 4.5 MIL/uL (ref 3.87–5.11)
RBC: 4.31 MIL/uL (ref 3.87–5.11)
RDW: 14.3 % (ref 11.5–15.5)
RDW: 14.4 % (ref 11.5–15.5)
WBC MORPHOLOGY: INCREASED
WBC: 13.6 10*3/uL — ABNORMAL HIGH (ref 4.0–10.5)
WBC: 5.1 10*3/uL (ref 4.0–10.5)

## 2014-01-26 LAB — COMPREHENSIVE METABOLIC PANEL
ALK PHOS: 60 U/L (ref 39–117)
ALT: 7 U/L (ref 0–35)
AST: 14 U/L (ref 0–37)
Albumin: 2.6 g/dL — ABNORMAL LOW (ref 3.5–5.2)
BUN: 44 mg/dL — AB (ref 6–23)
CO2: 20 meq/L (ref 19–32)
Calcium: 8.9 mg/dL (ref 8.4–10.5)
Chloride: 99 mEq/L (ref 96–112)
Creatinine, Ser: 0.91 mg/dL (ref 0.50–1.10)
GFR, EST AFRICAN AMERICAN: 60 mL/min — AB (ref >90)
GFR, EST NON AFRICAN AMERICAN: 52 mL/min — AB (ref >90)
Glucose, Bld: 209 mg/dL — ABNORMAL HIGH (ref 70–99)
Potassium: 3.7 mEq/L (ref 3.7–5.3)
SODIUM: 135 meq/L — AB (ref 137–147)
Total Bilirubin: 0.3 mg/dL (ref 0.3–1.2)
Total Protein: 6.6 g/dL (ref 6.0–8.3)

## 2014-01-26 LAB — BASIC METABOLIC PANEL
BUN: 46 mg/dL — AB (ref 6–23)
CALCIUM: 9.3 mg/dL (ref 8.4–10.5)
CHLORIDE: 96 meq/L (ref 96–112)
CO2: 19 meq/L (ref 19–32)
Creatinine, Ser: 0.86 mg/dL (ref 0.50–1.10)
GFR calc Af Amer: 64 mL/min — ABNORMAL LOW (ref >90)
GFR calc non Af Amer: 56 mL/min — ABNORMAL LOW (ref >90)
Glucose, Bld: 218 mg/dL — ABNORMAL HIGH (ref 70–99)
Potassium: 4 mEq/L (ref 3.7–5.3)
Sodium: 133 mEq/L — ABNORMAL LOW (ref 137–147)

## 2014-01-26 LAB — URINALYSIS, ROUTINE W REFLEX MICROSCOPIC
GLUCOSE, UA: NEGATIVE mg/dL
KETONES UR: NEGATIVE mg/dL
Nitrite: NEGATIVE
PH: 5.5 (ref 5.0–8.0)
PROTEIN: 30 mg/dL — AB
Specific Gravity, Urine: 1.016 (ref 1.005–1.030)
Urobilinogen, UA: 0.2 mg/dL (ref 0.0–1.0)

## 2014-01-26 LAB — URINE MICROSCOPIC-ADD ON

## 2014-01-26 LAB — LIPASE, BLOOD: LIPASE: 12 U/L (ref 11–59)

## 2014-01-26 MED ORDER — SODIUM CHLORIDE 0.9 % IV SOLN
INTRAVENOUS | Status: DC
  Administered 2014-01-27 – 2014-01-28 (×3): via INTRAVENOUS

## 2014-01-26 MED ORDER — ACETAMINOPHEN 650 MG RE SUPP: 650.0000 mg | Freq: Four times a day (QID) | RECTAL | Status: DC | PRN

## 2014-01-26 MED ORDER — DEXTROSE 5 % IV SOLN
1.0000 g | Freq: Once | INTRAVENOUS | Status: AC
  Administered 2014-01-26: 1 g via INTRAVENOUS
  Filled 2014-01-26: qty 10

## 2014-01-26 MED ORDER — DIVALPROEX SODIUM 125 MG PO CPSP
250.0000 mg | ORAL_CAPSULE | Freq: Every day | ORAL | Status: DC
  Administered 2014-01-27 (×2): 250 mg via ORAL
  Filled 2014-01-26 (×3): qty 2

## 2014-01-26 MED ORDER — LORAZEPAM 0.5 MG PO TABS: 0.5000 mg | ORAL_TABLET | Freq: Three times a day (TID) | ORAL | Status: DC | PRN

## 2014-01-26 MED ORDER — SODIUM CHLORIDE 0.9 % IV BOLUS (SEPSIS)
1000.0000 mL | INTRAVENOUS | Status: AC
  Administered 2014-01-26: 1000 mL via INTRAVENOUS

## 2014-01-26 MED ORDER — DEXTROSE 5 % IV SOLN
1.0000 g | INTRAVENOUS | Status: DC
  Administered 2014-01-27: 1 g via INTRAVENOUS
  Filled 2014-01-26 (×2): qty 10

## 2014-01-26 MED ORDER — ONDANSETRON HCL 4 MG PO TABS: 4.0000 mg | ORAL_TABLET | Freq: Four times a day (QID) | ORAL | Status: DC | PRN

## 2014-01-26 MED ORDER — SODIUM CHLORIDE 0.9 % IV BOLUS (SEPSIS)
1000.0000 mL | Freq: Once | INTRAVENOUS | Status: AC
  Administered 2014-01-26: 1000 mL via INTRAVENOUS

## 2014-01-26 MED ORDER — INSULIN ASPART 100 UNIT/ML ~~LOC~~ SOLN
0.0000 [IU] | Freq: Three times a day (TID) | SUBCUTANEOUS | Status: DC
  Administered 2014-01-27: 1 [IU] via SUBCUTANEOUS
  Administered 2014-01-27 – 2014-01-28 (×2): 2 [IU] via SUBCUTANEOUS
  Administered 2014-01-28: 1 [IU] via SUBCUTANEOUS

## 2014-01-26 MED ORDER — CEPHALEXIN 500 MG PO CAPS: 500.0000 mg | ORAL_CAPSULE | Freq: Four times a day (QID) | ORAL | Status: DC

## 2014-01-26 MED ORDER — CEPHALEXIN 500 MG PO CAPS
1000.0000 mg | ORAL_CAPSULE | Freq: Once | ORAL | Status: AC
  Administered 2014-01-26: 1000 mg via ORAL
  Filled 2014-01-26: qty 2

## 2014-01-26 MED ORDER — ENOXAPARIN SODIUM 30 MG/0.3ML ~~LOC~~ SOLN
30.0000 mg | SUBCUTANEOUS | Status: DC
  Administered 2014-01-27 – 2014-01-28 (×2): 30 mg via SUBCUTANEOUS
  Filled 2014-01-26 (×2): qty 0.3

## 2014-01-26 MED ORDER — TRAMADOL HCL 50 MG PO TABS: 50.0000 mg | ORAL_TABLET | Freq: Two times a day (BID) | ORAL | Status: DC | PRN

## 2014-01-26 MED ORDER — PANTOPRAZOLE SODIUM 40 MG PO TBEC
40.0000 mg | DELAYED_RELEASE_TABLET | Freq: Every day | ORAL | Status: DC
  Administered 2014-01-27 – 2014-01-28 (×2): 40 mg via ORAL
  Filled 2014-01-26 (×2): qty 1

## 2014-01-26 MED ORDER — SODIUM CHLORIDE 0.9 % IV SOLN
INTRAVENOUS | Status: AC
  Administered 2014-01-26: via INTRAVENOUS

## 2014-01-26 MED ORDER — ONDANSETRON HCL 4 MG/2ML IJ SOLN: 4.0000 mg | Freq: Four times a day (QID) | INTRAMUSCULAR | Status: DC | PRN

## 2014-01-26 MED ORDER — ACETAMINOPHEN 325 MG PO TABS: 650.0000 mg | ORAL_TABLET | Freq: Four times a day (QID) | ORAL | Status: DC | PRN

## 2014-01-26 NOTE — ED Notes (Signed)
Pt arrived to the ED with a complaint of diarrhea and emesis.  Pt began having symptoms last night around 2300 hrs.Pt had an episode of emesis prior to arrival.  Pt has had multiple episodes of diarrhea.Pt has dementia and has home health in place.  Pt does have a DNR in place.

## 2014-01-26 NOTE — ED Notes (Signed)
[facility] per report pt was seen earlier here at the ED for diarrhea and dehydration, was discharged and at home she had 3 more diarrhea episode, family PCP who advised them to bring her to the ED. PT in NAD.

## 2014-01-26 NOTE — H&P (Signed)
PCP:   Martha ClanShaw, Vir Whetstine, MD   Chief Complaint:  Diarrhea, dehydration  HPI: [name] is a 80 year old white female with a history of severe Alzheimer's dementia, severe aortic stenosis, and diabetes mellitus type 2 who presented to the emergency department twice within the last 24 hours with the complaint of profuse diarrhea. The patient has severe dementia and is unable to provide any history. Her son states that she was in her usual state of health until last couple days when she became lethargic and less interactive. By mouth intake has been poor. This morning she awoke around 4 AM with nausea and vomiting on several occasions. She was brought to the emergency department at that time and underwent laboratory evaluation that showed mild dehydration. She was given IV fluids and return home. Since returning home she has had profuse watery diarrhea. He believes that she's had at least 20 bowel movements since she left the emergency room this morning and was becoming dehydrated. She was brought back to the emergency department where her BUN is mildly elevated at 44, creatinine 0.9. Electrolytes are relatively normal. KUB shows no acute abdominal findings. Given the severity of her diarrhea she is being admitted for further management.  At baseline, the patient is minimally verbally interactive with a limited functional status. Requires assistance with transfers to her wheelchair which she spends most of the day. She suffered a hip fracture in 12/14 and spent 40 days at a skilled nursing facility prior to returning home with 24-hour assistance.  She has been enrolled in hospice in the past but was discharged to 2 stabilization of her dementia. Her son states that social work was assisting with hospice referral this morning from the emergency department.  Review of Systems:  Review of Systems - Unable to obtain view of systems from the patient do to her severe dementia.  Son denies any fevers, chills, sweats,  melena, hematochezia.  Past Medical History: Severe Alzheimer's Dementia  DM2  HTN  Hyperlipidemia  Hypothyroidism Vitamin D deficiency.  Osteoporosis with T12 compression fracture, left hip fracture (12/14)   Severe AS (12/14)- AVA 1.15 cm2  Past Surgical History  Procedure Laterality Date  . Abdominal hysterectomy  1946    partial  . Hip arthroplasty Left 10/14/2013    Procedure: ARTHROPLASTY BIPOLAR HIP;  Surgeon: Nadara MustardMarcus V Duda, MD;  Location: [facility];  Service: Orthopedics;  Laterality: Left;    Medications: Prior to Admission medications   Medication Sig Start Date End Date Taking? Authorizing Provider  cephALEXin (KEFLEX) 500 MG capsule Take 1 capsule (500 mg total) by mouth 4 (four) times daily. 01/26/14  Yes Dione Boozeavid Glick, MD  divalproex (DEPAKOTE SPRINKLE) 125 MG capsule Take 250 mg by mouth at bedtime.  10/24/13  Yes Sharee Holstereborah S Green, NP  Loperamide-Simethicone (IMODIUM MULTI-SYMPTOM RELIEF PO) Take 1 tablet by mouth 4 (four) times daily as needed (diarrhea).   Yes Historical Provider, MD  LORazepam (ATIVAN) 0.5 MG tablet Take 0.5 mg by mouth every 8 (eight) hours as needed for anxiety (anxiety).   Yes Historical Provider, MD  metFORMIN (GLUCOPHAGE) 500 MG tablet Take 500 mg by mouth 2 (two) times daily with a meal.   Yes Historical Provider, MD  omeprazole (PRILOSEC) 20 MG capsule Take 20 mg by mouth daily.   Yes Historical Provider, MD  ondansetron (ZOFRAN) 4 MG tablet Take 1 tablet (4 mg total) by mouth every 6 (six) hours as needed for nausea or vomiting. 01/26/14  Yes Dione Boozeavid Glick, MD  OVER  THE COUNTER MEDICATION Take 1 Can by mouth daily. premier protein   Yes Historical Provider, MD  traMADol (ULTRAM) 50 MG tablet Take 50 mg by mouth every 6 (six) hours as needed for moderate pain.   Yes Historical Provider, MD    Allergies:  No Known Allergies  Social History: Married, 1 son, 2 GC. Lives with husband. Has in home aide 24 hours a day  6th grade education.  Retired from  [employer].  [place]  Family History: Family History  Problem Relation Age of Onset  . Stroke Mother   . Other Father     accident    Physical Exam: Filed Vitals:   01/26/14 1756 01/26/14 1759 01/26/14 2011 01/26/14 2302  BP:  111/54 110/63 127/58  Pulse:  101 93 108  Temp:  98.4 F (36.9 C) 99 F (37.2 C) 98.9 F (37.2 C)  TempSrc:  Oral Rectal Axillary  Resp:  18 20 16   Height:    5' (1.524 m)  Weight:    44.4 kg (97 lb 14.2 oz)  SpO2: 98% 100% 97% 93%   General appearance: Minimally responsive to noxious stimuli; lethargic Head: Normocephalic, without obvious abnormality, atraumatic Eyes: conjunctivae/corneas clear. PERRL, EOM's intact.  Nose: Nares normal. Septum midline. Mucosa normal. No drainage or sinus tenderness. Throat: lips, mucosa, and tongue normal; teeth and gums normal Neck: no adenopathy, no carotid bruit, no JVD and thyroid not enlarged, symmetric, no tenderness/mass/nodules Resp: clear to auscultation bilaterally Cardio: Tachycardic with grade 306 systolic ejection murmur over the left upper sternal border and 3/6 holosystolic murmur over the apex GI: Scaphoid soft, non-tender; bowel sounds normal; no masses,  no organomegaly Extremities: extremities normal, atraumatic, no cyanosis or edema Pulses: 2+ and symmetric Lymph nodes: Cervical adenopathy: no cervical lymphadenopathy Neurologic: Lethargic, minimally responsive to noxious stimulus  Labs on Admission:   Recent Labs  01/26/14 0658 01/26/14 1955  NA 133* 135*  K 4.0 3.7  CL 96 99  CO2 19 20  GLUCOSE 218* 209*  BUN 46* 44*  CREATININE 0.86 0.91  CALCIUM 9.3 8.9    Recent Labs  01/26/14 1955  AST 14  ALT 7  ALKPHOS 60  BILITOT 0.3  PROT 6.6  ALBUMIN 2.6*    Recent Labs  01/26/14 1955  LIPASE 12    Recent Labs  01/26/14 0658 01/26/14 1955  WBC 13.6* 5.1  NEUTROABS 11.4* 3.3  HGB 11.3* 10.8*  HCT 35.2* 33.6*  MCV 78.2 78.0  PLT 288 319   No results found for this  basename: CKTOTAL, CKMB, CKMBINDEX, TROPONINI,  in the last 72 hours Lab Results  Component Value Date   INR 1.01 10/13/2013   No results found for this basename: TSH, T4TOTAL, FREET3, T3FREE, THYROIDAB,  in the last 72 hours No results found for this basename: VITAMINB12, FOLATE, FERRITIN, TIBC, IRON, RETICCTPCT,  in the last 72 hours  Radiological Exams on Admission: Dg Chest Port 1 View  01/26/2014   CLINICAL DATA:  Hypoxia.  Nausea and vomiting.  Diarrhea.  EXAM: PORTABLE CHEST - 1 VIEW  COMPARISON:  DG CHEST 1 VIEW dated 10/13/2013; DG CHEST 2 VIEW dated 04/05/2013; DG CHEST 2 VIEW dated 04/03/2013; CT ANGIO CHEST W/CM &/OR WO/CM dated 01/09/2011  FINDINGS: Cardiac silhouette moderately enlarged but stable. Thoracic aorta tortuous and atherosclerotic. Mildly prominent bronchovascular markings diffusely, unchanged. Lungs otherwise clear. No localized airspace consolidation. No pleural effusions. No pneumothorax. Normal pulmonary vascularity.  IMPRESSION: Stable cardiomegaly. No acute cardiopulmonary disease.   Electronically  Signed   By: Hulan Saas M.D.   On: 01/26/2014 06:25   Dg Abd 2 Views  01/26/2014   CLINICAL DATA:  Abdominal pain and diarrhea.  EXAM: ABDOMEN - 2 VIEW  COMPARISON:  None.  FINDINGS: No evidence of dilated bowel loops. Scattered air-fluid levels are seen in both small bowel and colon, which are nonspecific. No evidence of free air. No definite radiopaque calculi visualized although several pelvic phleboliths are noted. Left hip prosthesis also noted.  IMPRESSION: Nonspecific, nonobstructive bowel gas pattern.   Electronically Signed   By: Myles Rosenthal M.D.   On: 01/26/2014 19:52    Assessment/Plan Active Problems: 1. Dehydration secondary to Diarrhea- likely viral gastroenteritis (suspect Norovirus) though C. difficile is a possibility given her recent antibiotic therapy with Cipro. C. difficile PCR and stool cultures have been ordered. We'll hold off on antimicrobial  treatment pending these results. Clear liquid diet. Kaopectate as needed.  2. [place] to dehydration. 3.Urinary tract infection- will treat with Rocephin given increased lethargy. Urine cultures ordered. 4. Alzheimer's dementia, severe-she is approaching end-stage Alzheimer's disease and is appropriate for hospice care. 5. Diabetes mellitus-we'll hold metformin do to volume depletion and risk of acidosis and cover with sliding scale insulin. 6. Protein-calorie malnutrition, severe-will try to add supplements when she is tolerating a diet the 7. Aortic stenosis, severe-Reviewed prior echocardiogram. Discussed risks of sudden cardiac event in the setting of volume depletion with her son. 8. Burnadette Peter is DO NOT RESUSCITATE and will transition to hospice therapy. If we, this can help limit need for recurrent hospitalizations.  Martha Clan 01/26/2014, 11:36 PM

## 2014-01-26 NOTE — Progress Notes (Signed)
   CARE MANAGEMENT NOTE 01/26/2014  Patient:  Jerene CannySUMMERS,Daisia S   Account Number:  [id number]  Date Initiated:  01/26/2014  Documentation initiated by:  [facility],Wandra Babin  Subjective/Objective Assessment:   diarrhea and vomiting     Action/Plan:   lives at home with husband   Anticipated DC Date:  01/26/2014   Anticipated DC Plan:  HOME W HOSPICE CARE      DC Planning Services  CM consult      [facility]   Choice offered to / List presented to:  C-3 Spouse        HH arranged  HH-1 RN      HH agency  [facility] [place]   Status of service:  Completed, signed off Medicare Important Message given?   (If response is "NO", the following Medicare IM given date fields will be blank) Date Medicare IM given:   Date Additional Medicare IM given:    Discharge Disposition:  HOME W HOSPICE CARE  Per UR Regulation:    If discussed at Long Length of Stay Meetings, dates discussed:    Comments:  01/26/2014 1420 NCM contacted [facility] and waiting return call to arrange Hospice. Isidoro DonningAlesia Singleton Hickox RN CCM Case Mgmt phone [phone]  01/26/2014 1030 NCM spoke to husband, Cloria SpringWilliam Blackie and son, Dustin FolksBilly Massengale. States pt was in [facility] two weeks ago and dc from their services. They are requesting [facility] once again. Will arrange Hospice.  Isidoro DonningAlesia Nayquan Evinger RN CCM Case Mgmt phone [phone]

## 2014-01-26 NOTE — ED Provider Notes (Signed)
CSN: [id number]     Arrival date & time 01/26/14  1752 History   First MD Initiated Contact with Patient 01/26/14 1807     Chief Complaint  Patient presents with  . Diarrhea     (Consider location/radiation/quality/duration/timing/severity/associated sxs/prior Treatment) Patient is a 80 y.o. female presenting with diarrhea. The history is provided by the patient.  Diarrhea Quality:  Watery Severity:  Moderate Onset quality:  Sudden Number of episodes:  20+ Timing:  Constant Progression:  Worsening Relieved by:  Nothing Exacerbated by: immodium. Ineffective treatments:  None tried Associated symptoms: vomiting   Associated symptoms: no abdominal pain, no fever and no headaches     Past Medical History  Diagnosis Date  . Diabetes mellitus   . Dementia   . Thyroid disease   . Hypertension   . Murmur, heart 10/13/2013   Past Surgical History  Procedure Laterality Date  . Abdominal hysterectomy  1946    partial  . Hip arthroplasty Left 10/14/2013    Procedure: ARTHROPLASTY BIPOLAR HIP;  Surgeon: Nadara Mustard, MD;  Location: [facility];  Service: Orthopedics;  Laterality: Left;   Family History  Problem Relation Age of Onset  . Stroke Mother   . Other Father     accident   History  Substance Use Topics  . Smoking status: Never Smoker   . Smokeless tobacco: Never Used  . Alcohol Use: No   OB History   Grav Para Term Preterm Abortions TAB SAB Ect Mult Living                 Review of Systems  Constitutional: Negative for fever and fatigue.  HENT: Negative for congestion and drooling.   Eyes: Negative for pain.  Respiratory: Negative for cough and shortness of breath.   Cardiovascular: Negative for chest pain.  Gastrointestinal: Positive for nausea, vomiting and diarrhea. Negative for abdominal pain.  Genitourinary: Negative for dysuria and hematuria.  Musculoskeletal: Negative for back pain, gait problem and neck pain.  Skin: Negative for color change.   Neurological: Negative for dizziness and headaches.  Hematological: Negative for adenopathy.  Psychiatric/Behavioral: Negative for behavioral problems.  All other systems reviewed and are negative.      Allergies  Review of patient's allergies indicates no known allergies.  Home Medications   Current Outpatient Rx  Name  Route  Sig  Dispense  Refill  . cephALEXin (KEFLEX) 500 MG capsule   Oral   Take 1 capsule (500 mg total) by mouth 4 (four) times daily.   40 capsule   0   . divalproex (DEPAKOTE SPRINKLE) 125 MG capsule   Oral   Take 250 mg by mouth at bedtime.          . Loperamide-Simethicone (IMODIUM MULTI-SYMPTOM RELIEF PO)   Oral   Take 1 tablet by mouth 4 (four) times daily as needed (diarrhea).         . LORazepam (ATIVAN) 0.5 MG tablet   Oral   Take 0.5 mg by mouth every 8 (eight) hours as needed for anxiety (anxiety).         . metFORMIN (GLUCOPHAGE) 500 MG tablet   Oral   Take 500 mg by mouth 2 (two) times daily with a meal.         . omeprazole (PRILOSEC) 20 MG capsule   Oral   Take 20 mg by mouth daily.         . ondansetron (ZOFRAN) 4 MG tablet   Oral  Take 1 tablet (4 mg total) by mouth every 6 (six) hours as needed for nausea or vomiting.   20 tablet   0   . OVER THE COUNTER MEDICATION   Oral   Take 1 Can by mouth daily. premier protein         . traMADol (ULTRAM) 50 MG tablet   Oral   Take 50 mg by mouth every 6 (six) hours as needed for moderate pain.          BP 111/54  Pulse 101  Temp(Src) 98.4 F (36.9 C) (Oral)  Resp 18  SpO2 100% Physical Exam  Nursing note and vitals reviewed. Constitutional: She appears well-developed.  Thin in appearance.   HENT:  Head: Normocephalic.  Mouth/Throat: Oropharynx is clear and moist. No oropharyngeal exudate.  Dry oral mucous membranes.   Eyes: Conjunctivae and EOM are normal. Pupils are equal, round, and reactive to light.  Neck: Normal range of motion. Neck supple.   Cardiovascular: Regular rhythm and intact distal pulses.  Exam reveals no gallop and no friction rub.   Murmur heard. HR 101  Pulmonary/Chest: Effort normal and breath sounds normal. No respiratory distress. She has no wheezes.  Abdominal: Soft. Bowel sounds are normal. There is tenderness (soft abdomen, w/ mild non focal ttp). There is no rebound and no guarding.  Musculoskeletal: Normal range of motion. She exhibits no edema and no tenderness.  Neurological:  Very drowsy. Will moan w/ arousal. Cannot answer questions.   Skin: Skin is warm and dry.  Psychiatric: She has a normal mood and affect. Her behavior is normal.    ED Course  Procedures (including critical care time) Labs Review Labs Reviewed  CBC WITH DIFFERENTIAL - Abnormal; Notable for the following:    Hemoglobin 10.8 (*)    HCT 33.6 (*)    MCH 25.1 (*)    Monocytes Relative 18 (*)    All other components within normal limits  COMPREHENSIVE METABOLIC PANEL - Abnormal; Notable for the following:    Sodium 135 (*)    Glucose, Bld 209 (*)    BUN 44 (*)    Albumin 2.6 (*)    GFR calc non Af Amer 52 (*)    GFR calc Af Amer 60 (*)    All other components within normal limits  CLOSTRIDIUM DIFFICILE BY PCR  STOOL CULTURE  LIPASE, BLOOD   Imaging Review Dg Chest Port 1 View  01/26/2014   CLINICAL DATA:  Hypoxia.  Nausea and vomiting.  Diarrhea.  EXAM: PORTABLE CHEST - 1 VIEW  COMPARISON:  DG CHEST 1 VIEW dated 10/13/2013; DG CHEST 2 VIEW dated 04/05/2013; DG CHEST 2 VIEW dated 04/03/2013; CT ANGIO CHEST W/CM &/OR WO/CM dated 01/09/2011  FINDINGS: Cardiac silhouette moderately enlarged but stable. Thoracic aorta tortuous and atherosclerotic. Mildly prominent bronchovascular markings diffusely, unchanged. Lungs otherwise clear. No localized airspace consolidation. No pleural effusions. No pneumothorax. Normal pulmonary vascularity.  IMPRESSION: Stable cardiomegaly. No acute cardiopulmonary disease.   Electronically Signed   By:  Hulan Saas M.D.   On: 01/26/2014 06:25     EKG Interpretation None      MDM   Final diagnoses:  Diarrhea  Vomiting  Dehydration  UTI (lower urinary tract infection)    6:46 PM 80 y.o. female evaluated earlier today for vomiting and diarrhea who presents with ongoing symptoms. The family notes that the patient had an episode of emesis and diarrhea overnight. She was evaluated this morning with labwork, urinalysis, and chest x-ray.  She was found to have a UTI and suspected viral gastroenteritis. Family denies any fevers at home. She has had decreased level of alertness today. She is afebrile and mildly tachycardic here. She is lethargic-appearing on exam and mildly dehydrated. She cannot answer any questions for me. The family believes that she had some head and neck pain yesterday and possibly some abdominal pain as well. Her abdomen is soft w/ mild non-focal ttp. She has had constant diarrhea at home and is not taking po. Will repeat screening labwork, IV fluids, stool studies. I discussed the option of CT scan of abdomen and head and possibility of lumbar puncture in her workup. Patient is a DO NOT RESUSCITATE and the family would prefer to not do an extensive workup.   The patient will be [facility] Any medications given in the ED during this visit are listed below:  Medications  sodium chloride 0.9 % bolus 1,000 mL (1,000 mLs Intravenous New Bag/Given 01/26/14 2145)  cefTRIAXone (ROCEPHIN) 1 g in dextrose 5 % 50 mL IVPB (1 g Intravenous New Bag/Given 01/26/14 2155)      Junius ArgyleForrest S Kinzlee Selvy, MD 01/26/14 2311

## 2014-01-26 NOTE — Discharge Instructions (Signed)
Give loperamide (Imodium AD) as needed for diarrhea.  Nausea and Vomiting Nausea is a sick feeling that often comes before throwing up (vomiting). Vomiting is a reflex where stomach contents come out of your mouth. Vomiting can cause severe loss of body fluids (dehydration). Children and elderly adults can become dehydrated quickly, especially if they also have diarrhea. Nausea and vomiting are symptoms of a condition or disease. It is important to find the cause of your symptoms. CAUSES   Direct irritation of the stomach lining. This irritation can result from increased acid production (gastroesophageal reflux disease), infection, food poisoning, taking certain medicines (such as nonsteroidal anti-inflammatory drugs), alcohol use, or tobacco use.  Signals from the brain.These signals could be caused by a headache, heat exposure, an inner ear disturbance, increased pressure in the brain from injury, infection, a tumor, or a concussion, pain, emotional stimulus, or metabolic problems.  An obstruction in the gastrointestinal tract (bowel obstruction).  Illnesses such as diabetes, hepatitis, gallbladder problems, appendicitis, kidney problems, cancer, sepsis, atypical symptoms of a heart attack, or eating disorders.  Medical treatments such as chemotherapy and radiation.  Receiving medicine that makes you sleep (general anesthetic) during surgery. DIAGNOSIS Your caregiver may ask for tests to be done if the problems do not improve after a few days. Tests may also be done if symptoms are severe or if the reason for the nausea and vomiting is not clear. Tests may include:  Urine tests.  Blood tests.  Stool tests.  Cultures (to look for evidence of infection).  X-rays or other imaging studies. Test results can help your caregiver make decisions about treatment or the need for additional tests. TREATMENT You need to stay well hydrated. Drink frequently but in small amounts.You may wish to  drink water, sports drinks, clear broth, or eat frozen ice pops or gelatin dessert to help stay hydrated.When you eat, eating slowly may help prevent nausea.There are also some antinausea medicines that may help prevent nausea. HOME CARE INSTRUCTIONS   Take all medicine as directed by your caregiver.  If you do not have an appetite, do not force yourself to eat. However, you must continue to drink fluids.  If you have an appetite, eat a normal diet unless your caregiver tells you differently.  Eat a variety of complex carbohydrates (rice, wheat, potatoes, bread), lean meats, yogurt, fruits, and vegetables.  Avoid high-fat foods because they are more difficult to digest.  Drink enough water and fluids to keep your urine clear or pale yellow.  If you are dehydrated, ask your caregiver for specific rehydration instructions. Signs of dehydration may include:  Severe thirst.  Dry lips and mouth.  Dizziness.  Dark urine.  Decreasing urine frequency and amount.  Confusion.  Rapid breathing or pulse. SEEK IMMEDIATE MEDICAL CARE IF:   You have blood or brown flecks (like coffee grounds) in your vomit.  You have black or bloody stools.  You have a severe headache or stiff neck.  You are confused.  You have severe abdominal pain.  You have chest pain or trouble breathing.  You do not urinate at least once every 8 hours.  You develop cold or clammy skin.  You continue to vomit for longer than 24 to 48 hours.  You have a fever. MAKE SURE YOU:   Understand these instructions.  Will watch your condition.  Will get help right away if you are not doing well or get worse. Document Released: 10/25/2005 Document Revised: 01/17/2012 Document Reviewed: 03/24/2011 ExitCare Patient  Information 2014 [place].  Diarrhea Diarrhea is frequent loose and watery bowel movements. It can cause you to feel weak and dehydrated. Dehydration can cause you to become tired and  thirsty, have a dry mouth, and have decreased urination that often is dark yellow. Diarrhea is a sign of another problem, most often an infection that will not last long. In most cases, diarrhea typically lasts 2 3 days. However, it can last longer if it is a sign of something more serious. It is important to treat your diarrhea as directed by your caregive to lessen or prevent future episodes of diarrhea. CAUSES  Some common causes include:  Gastrointestinal infections caused by viruses, bacteria, or parasites.  Food poisoning or food allergies.  Certain medicines, such as antibiotics, chemotherapy, and laxatives.  Artificial sweeteners and fructose.  Digestive disorders. HOME CARE INSTRUCTIONS  Ensure adequate fluid intake (hydration): have 1 cup (8 oz) of fluid for each diarrhea episode. Avoid fluids that contain simple sugars or sports drinks, fruit juices, whole milk products, and sodas. Your urine should be clear or pale yellow if you are drinking enough fluids. Hydrate with an oral rehydration solution that you can purchase at pharmacies, retail stores, and online. You can prepare an oral rehydration solution at home by mixing the following ingredients together:    tsp table salt.   tsp baking soda.   tsp salt substitute containing potassium chloride.  1  tablespoons sugar.  1 L (34 oz) of water.  Certain foods and beverages may increase the speed at which food moves through the gastrointestinal (GI) tract. These foods and beverages should be avoided and include:  Caffeinated and alcoholic beverages.  High-fiber foods, such as raw fruits and vegetables, nuts, seeds, and whole grain breads and cereals.  Foods and beverages sweetened with sugar alcohols, such as xylitol, sorbitol, and mannitol.  Some foods may be well tolerated and may help thicken stool including:  Starchy foods, such as rice, toast, pasta, low-sugar cereal, oatmeal, grits, baked potatoes, crackers, and  bagels.  Bananas.  Applesauce.  Add probiotic-rich foods to help increase healthy bacteria in the GI tract, such as yogurt and fermented milk products.  Wash your hands well after each diarrhea episode.  Only take over-the-counter or prescription medicines as directed by your caregiver.  Take a warm bath to relieve any burning or pain from frequent diarrhea episodes. SEEK IMMEDIATE MEDICAL CARE IF:   You are unable to keep fluids down.  You have persistent vomiting.  You have blood in your stool, or your stools are black and tarry.  You do not urinate in 6 8 hours, or there is only a small amount of very dark urine.  You have abdominal pain that increases or localizes.  You have weakness, dizziness, confusion, or lightheadedness.  You have a severe headache.  Your diarrhea gets worse or does not get better.  You have a fever or persistent symptoms for more than 2 3 days.  You have a fever and your symptoms suddenly get worse. MAKE SURE YOU:   Understand these instructions.  Will watch your condition.  Will get help right away if you are not doing well or get worse. Document Released: 10/15/2002 Document Revised: 10/11/2012 Document Reviewed: 07/02/2012 [facility] Patient Information 2014 [place].  Urinary Tract Infection Urinary tract infections (UTIs) can develop anywhere along your urinary tract. Your urinary tract is your body's drainage system for removing wastes and extra water. Your urinary tract includes two kidneys, two ureters,  a bladder, and a urethra. Your kidneys are a pair of bean-shaped organs. Each kidney is about the size of your fist. They are located below your ribs, one on each side of your spine. CAUSES Infections are caused by microbes, which are microscopic organisms, including fungi, viruses, and bacteria. These organisms are so small that they can only be seen through a microscope. Bacteria are the microbes that most commonly cause  UTIs. SYMPTOMS  Symptoms of UTIs may vary by age and gender of the patient and by the location of the infection. Symptoms in young women typically include a frequent and intense urge to urinate and a painful, burning feeling in the bladder or urethra during urination. Older women and men are more likely to be tired, shaky, and weak and have muscle aches and abdominal pain. A fever may mean the infection is in your kidneys. Other symptoms of a kidney infection include pain in your back or sides below the ribs, nausea, and vomiting. DIAGNOSIS To diagnose a UTI, your caregiver will ask you about your symptoms. Your caregiver also will ask to provide a urine sample. The urine sample will be tested for bacteria and white blood cells. White blood cells are made by your body to help fight infection. TREATMENT  Typically, UTIs can be treated with medication. Because most UTIs are caused by a bacterial infection, they usually can be treated with the use of antibiotics. The choice of antibiotic and length of treatment depend on your symptoms and the type of bacteria causing your infection. HOME CARE INSTRUCTIONS  If you were prescribed antibiotics, take them exactly as your caregiver instructs you. Finish the medication even if you feel better after you have only taken some of the medication.  Drink enough water and fluids to keep your urine clear or pale yellow.  Avoid caffeine, tea, and carbonated beverages. They tend to irritate your bladder.  Empty your bladder often. Avoid holding urine for long periods of time.  Empty your bladder before and after sexual intercourse.  After a bowel movement, women should cleanse from front to back. Use each tissue only once. SEEK MEDICAL CARE IF:   You have back pain.  You develop a fever.  Your symptoms do not begin to resolve within 3 days. SEEK IMMEDIATE MEDICAL CARE IF:   You have severe back pain or lower abdominal pain.  You develop chills.  You  have nausea or vomiting.  You have continued burning or discomfort with urination. MAKE SURE YOU:   Understand these instructions.  Will watch your condition.  Will get help right away if you are not doing well or get worse. Document Released: 08/04/2005 Document Revised: 04/25/2012 Document Reviewed: 12/03/2011 [facility] Patient Information 2014 [place], [place].  Ondansetron tablets What is this medicine? ONDANSETRON (on DAN se tron) is used to treat nausea and vomiting caused by chemotherapy. It is also used to prevent or treat nausea and vomiting after surgery. This medicine may be used for other purposes; ask your health care provider or pharmacist if you have questions. COMMON BRAND NAME(S): Zofran What should I tell my health care provider before I take this medicine? They need to know if you have any of these conditions: -heart disease -history of irregular heartbeat -liver disease -low levels of magnesium or potassium in the blood -an unusual or allergic reaction to ondansetron, granisetron, other medicines, foods, dyes, or preservatives -pregnant or trying to get pregnant -breast-feeding How should I use this medicine? Take this medicine by mouth  with a glass of water. Follow the directions on your prescription label. Take your doses at regular intervals. Do not take your medicine more often than directed. Talk to your pediatrician regarding the use of this medicine in children. Special care may be needed. Overdosage: If you think you have taken too much of this medicine contact a poison control center or emergency room at once. NOTE: This medicine is only for you. Do not share this medicine with others. What if I miss a dose? If you miss a dose, take it as soon as you can. If it is almost time for your next dose, take only that dose. Do not take double or extra doses. What may interact with this medicine? Do not take this medicine with any of the following  medications: -apomorphine -certain medicines for fungal infections like fluconazole, itraconazole, ketoconazole, posaconazole, voriconazole -cisapride -dofetilide -dronedarone -pimozide -thioridazine -ziprasidone  This medicine may also interact with the following medications: -carbamazepine -certain medicines for depression, anxiety, or psychotic disturbances -fentanyl -linezolid -MAOIs like Carbex, Eldepryl, Marplan, Nardil, and Parnate -methylene blue (injected into a vein) -other medicines that prolong the QT interval (cause an abnormal heart rhythm) -phenytoin -rifampicin -tramadol This list may not describe all possible interactions. Give your health care provider a list of all the medicines, herbs, non-prescription drugs, or dietary supplements you use. Also tell them if you smoke, drink alcohol, or use illegal drugs. Some items may interact with your medicine. What should I watch for while using this medicine? Check with your doctor or health care professional right away if you have any sign of an allergic reaction. What side effects may I notice from receiving this medicine? Side effects that you should report to your doctor or health care professional as soon as possible: -allergic reactions like skin rash, itching or hives, swelling of the face, lips or tongue -breathing problems -confusion -dizziness -fast or irregular heartbeat -feeling faint or lightheaded, falls -fever and chills -loss of balance or coordination -seizures -sweating -swelling of the hands or feet -tightness in the chest -tremors -unusually weak or tired Side effects that usually do not require medical attention (report to your doctor or health care professional if they continue or are bothersome): -constipation or diarrhea -headache This list may not describe all possible side effects. Call your doctor for medical advice about side effects. You may report side effects to FDA at  1-800-FDA-1088. Where should I keep my medicine? Keep out of the reach of children. Store between 2 and 30 degrees C (36 and 86 degrees F). Throw away any unused medicine after the expiration date. NOTE: This sheet is a summary. It may not cover all possible information. If you have questions about this medicine, talk to your doctor, pharmacist, or health care provider.  2014, Elsevier/Gold Standard. (2013-08-01 16:27:45)  Cephalexin tablets or capsules What is this medicine? CEPHALEXIN (sef a LEX in) is a cephalosporin antibiotic. It is used to treat certain kinds of bacterial infections It will not work for colds, flu, or other viral infections. This medicine may be used for other purposes; ask your health care provider or pharmacist if you have questions. COMMON BRAND NAME(S): Biocef, Keflex, Keftab What should I tell my health care provider before I take this medicine? They need to know if you have any of these conditions: -kidney disease -stomach or intestine problems, especially colitis -an unusual or allergic reaction to cephalexin, other cephalosporins, penicillins, other antibiotics, medicines, foods, dyes or preservatives -pregnant or trying to get  pregnant -breast-feeding How should I use this medicine? Take this medicine by mouth with a full glass of water. Follow the directions on the prescription label. This medicine can be taken with or without food. Take your medicine at regular intervals. Do not take your medicine more often than directed. Take all of your medicine as directed even if you think you are better. Do not skip doses or stop your medicine early. Talk to your pediatrician regarding the use of this medicine in children. While this drug may be prescribed for selected conditions, precautions do apply. Overdosage: If you think you have taken too much of this medicine contact a poison control center or emergency room at once. NOTE: This medicine is only for you. Do not  share this medicine with others. What if I miss a dose? If you miss a dose, take it as soon as you can. If it is almost time for your next dose, take only that dose. Do not take double or extra doses. There should be at least 4 to 6 hours between doses. What may interact with this medicine? -probenecid -some other antibiotics This list may not describe all possible interactions. Give your health care provider a list of all the medicines, herbs, non-prescription drugs, or dietary supplements you use. Also tell them if you smoke, drink alcohol, or use illegal drugs. Some items may interact with your medicine. What should I watch for while using this medicine? Tell your doctor or health care professional if your symptoms do not begin to improve in a few days. Do not treat diarrhea with over the counter products. Contact your doctor if you have diarrhea that lasts more than 2 days or if it is severe and watery. If you have diabetes, you may get a false-positive result for sugar in your urine. Check with your doctor or health care professional. What side effects may I notice from receiving this medicine? Side effects that you should report to your doctor or health care professional as soon as possible: -allergic reactions like skin rash, itching or hives, swelling of the face, lips, or tongue -breathing problems -pain or trouble passing urine -redness, blistering, peeling or loosening of the skin, including inside the mouth -severe or watery diarrhea -unusually weak or tired -yellowing of the eyes, skin Side effects that usually do not require medical attention (report to your doctor or health care professional if they continue or are bothersome): -gas or heartburn -genital or anal irritation -headache -joint or muscle pain -nausea, vomiting This list may not describe all possible side effects. Call your doctor for medical advice about side effects. You may report side effects to FDA at  1-800-FDA-1088. Where should I keep my medicine? Keep out of the reach of children. Store at room temperature between 59 and 86 degrees F (15 and 30 degrees C). Throw away any unused medicine after the expiration date. NOTE: This sheet is a summary. It may not cover all possible information. If you have questions about this medicine, talk to your doctor, pharmacist, or health care provider.  2014, Elsevier/Gold Standard. (2008-01-29 17:09:13)

## 2014-01-26 NOTE — Progress Notes (Addendum)
CSW met with pt and family to discuss possible in-home care options. Per son, pt fell in December, went to [facility] for 40 days and d/c to hospice. [name] health improved and was no longer appropriate for hospice. Pt currently receives private duty nursing around the clock--has no home health team. Family wants family to return to hospice. . CSW also discussed option and benefits of home health--family was interested in more information, so CSW offered to consult CM.  CSW discussed case with MD Plunkett. MD to put in CM order.  Rochele Pages,     ED CSW  phone: [phone] 9:45am

## 2014-01-26 NOTE — ED Notes (Signed)
Called [employer] about consult. Stated she will be coming to see them shortly. Made pt's spouse aware.

## 2014-01-26 NOTE — ED Notes (Signed)
Bed: WA04 Expected date: 01/26/14 Expected time: 5:40 PM Means of arrival: Ambulance Comments: Returning pt family ? Dehydration due to diarrhea

## 2014-01-26 NOTE — Progress Notes (Signed)
   CARE MANAGEMENT NOTE 01/26/2014  Patient:  Anne [name],Anne [name]   Account Number:  [id number]  Date Initiated:  01/26/2014  Documentation initiated by:  [facility]AVIS,Brysen Shankman  Subjective/Objective Assessment:   diarrhea and vomiting     Action/Plan:   lives at home with husband   Anticipated DC Date:  01/26/2014   Anticipated DC Plan:  HOME W HOSPICE CARE      DC Planning Services  CM consult      [facility]   Choice offered to / List presented to:  C-3 Spouse        HH arranged  HH-1 RN      HH [facility] [place]   Status of service:  Completed, signed off Medicare Important Message given?   (If response is "NO", the following Medicare IM given date fields will be blank) Date Medicare IM given:   Date Additional Medicare IM given:    Discharge Disposition:  HOME W HOSPICE CARE  Per UR Regulation:    If discussed at Long Length of Stay Meetings, dates discussed:    Comments:  01/26/2014 1430 NCM spoke to HudsonJenny, on call RN [facility]. NCM will fax orders to [facility]. States she will follow up with PCP on Monday and contact family for referral. Isidoro Donninglesia Machaela Caterino RN CCM Case Mgmt phone [phone]  01/26/2014 1420 NCM contacted [facility] and waiting return call to arrange Hospice. Isidoro DonningAlesia Chinaza Rooke RN CCM Case Mgmt phone [phone]  01/26/2014 1030 NCM spoke to husband, Cloria SpringWilliam Enriques and son, Dustin FolksBilly Latulippe. States pt was in [facility] two weeks ago and dc from their services. They are requesting [facility] once again. Will arrange Hospice.  Isidoro DonningAlesia Quenisha Lovins RN CCM Case Mgmt phone [phone]

## 2014-01-26 NOTE — ED Notes (Signed)
X-ray at bedside

## 2014-01-26 NOTE — ED Provider Notes (Signed)
CSN: [id number]     Arrival date & time 01/26/14  0450 History   First MD Initiated Contact with Patient 01/26/14 0455     Chief Complaint  Patient presents with  . Diarrhea  . Emesis     (Consider location/radiation/quality/duration/timing/severity/associated sxs/prior Treatment) Patient is a 80 y.o. female presenting with diarrhea and vomiting. The history is provided by a relative. The history is limited by the condition of the patient (Dementia).  Diarrhea Associated symptoms: vomiting   Emesis Associated symptoms: diarrhea   She started having vomiting and diarrhea this evening. However, she has been more lethargic than normal for the last 2 days and has had decreased appetite. She's no longer feeding herself. She has severe dementia and has 24-hour home health care. She is DO NOT RESUSCITATE. There is no known fever at home.  Past Medical History  Diagnosis Date  . Diabetes mellitus   . Dementia   . Thyroid disease   . Hypertension   . Murmur, heart 10/13/2013   Past Surgical History  Procedure Laterality Date  . Abdominal hysterectomy  1946    partial  . Hip arthroplasty Left 10/14/2013    Procedure: ARTHROPLASTY BIPOLAR HIP;  Surgeon: Nadara MustardMarcus V Duda, MD;  Location: [facility];  Service: Orthopedics;  Laterality: Left;   Family History  Problem Relation Age of Onset  . Stroke Mother   . Other Father     accident   History  Substance Use Topics  . Smoking status: Never Smoker   . Smokeless tobacco: Never Used  . Alcohol Use: No   OB History   Grav Para Term Preterm Abortions TAB SAB Ect Mult Living                 Review of Systems  Unable to perform ROS: Dementia  Gastrointestinal: Positive for vomiting and diarrhea.      Allergies  Review of patient's allergies indicates no known allergies.  Home Medications   Current Outpatient Rx  Name  Route  Sig  Dispense  Refill  . cephALEXin (KEFLEX) 500 MG capsule   Oral   Take 1 capsule (500 mg total) by  mouth 4 (four) times daily.   20 capsule   0   . divalproex (DEPAKOTE SPRINKLE) 125 MG capsule   Oral   Take 2 capsules (250 mg total) by mouth at bedtime.   60 capsule   11   . lactose free nutrition (BOOST PLUS) LIQD   Oral   Take 237 mLs by mouth 3 (three) times daily with meals.      0   . metFORMIN (GLUCOPHAGE) 500 MG tablet   Oral   Take 1 tablet (500 mg total) by mouth every evening.   30 tablet   0   . traMADol (ULTRAM) 50 MG tablet   Oral   Take 50 mg by mouth every 6 (six) hours as needed for moderate pain.          BP 136/67  Pulse 104  Temp(Src) 98.5 F (36.9 C) (Rectal)  Resp 28  SpO2 92% Physical Exam  Nursing note and vitals reviewed.  80 year old female, resting comfortably and in no acute distress. Vital signs are significant for tachycardia with heart rate 104, and tachypnea with respiratory rate of 28. Oxygen saturation is 92%, which is normal. Head is normocephalic and atraumatic. PERRLA, EOMI. Oropharynx is clear. Mucous membranes are dry. Neck is nontender and supple without adenopathy or JVD. Back is nontender and  there is no CVA tenderness. Lungs are clear without rales, wheezes, or rhonchi. Chest is nontender. Heart has regular rate and rhythm with 2-3/6 holosystolic murmur. Abdomen is soft, flat, nontender without masses or hepatosplenomegaly and peristalsis is hjypoactive. Extremities have no cyanosis or edema, full range of motion is present. Skin is warm and dry without rash. Neurologic: She is sleepy but arousable. She does not speak anything that is intelligible and does not follow commands. Cranial nerves are intact, there are no gross motor or sensory deficits.  ED Course  Procedures (including critical care time) Labs Review Results for orders placed during the hospital encounter of 01/26/14  CBC WITH DIFFERENTIAL      Result Value Ref Range   WBC 13.6 (*) 4.0 - 10.5 K/uL   RBC 4.50  3.87 - 5.11 MIL/uL   Hemoglobin 11.3 (*)  12.0 - 15.0 g/dL   HCT 40.9 (*) 81.1 - 91.4 %   MCV 78.2  78.0 - 100.0 fL   MCH 25.1 (*) 26.0 - 34.0 pg   MCHC 32.1  30.0 - 36.0 g/dL   RDW 78.2  95.6 - 21.3 %   Platelets 288  150 - 400 K/uL   Neutrophils Relative % 84 (*) 43 - 77 %   Neutro Abs 11.4 (*) 1.7 - 7.7 K/uL   Lymphocytes Relative 6 (*) 12 - 46 %   Lymphs Abs 0.8  0.7 - 4.0 K/uL   Monocytes Relative 11  3 - 12 %   Monocytes Absolute 1.5 (*) 0.1 - 1.0 K/uL   Eosinophils Relative 0  0 - 5 %   Eosinophils Absolute 0.0  0.0 - 0.7 K/uL   Basophils Relative 0  0 - 1 %   Basophils Absolute 0.0  0.0 - 0.1 K/uL   WBC Morphology INCREASED BANDS (>20% BANDS)    BASIC METABOLIC PANEL      Result Value Ref Range   Sodium 133 (*) 137 - 147 mEq/L   Potassium 4.0  3.7 - 5.3 mEq/L   Chloride 96  96 - 112 mEq/L   CO2 19  19 - 32 mEq/L   Glucose, Bld 218 (*) 70 - 99 mg/dL   BUN 46 (*) 6 - 23 mg/dL   Creatinine, Ser 0.86  0.50 - 1.10 mg/dL   Calcium 9.3  8.4 - 57.8 mg/dL   GFR calc non Af Amer 56 (*) >90 mL/min   GFR calc Af Amer 64 (*) >90 mL/min  URINALYSIS, ROUTINE W REFLEX MICROSCOPIC      Result Value Ref Range   Color, Urine YELLOW  YELLOW   APPearance TURBID (*) CLEAR   Specific Gravity, Urine 1.016  1.005 - 1.030   pH 5.5  5.0 - 8.0   Glucose, UA NEGATIVE  NEGATIVE mg/dL   Hgb urine dipstick SMALL (*) NEGATIVE   Bilirubin Urine SMALL (*) NEGATIVE   Ketones, ur NEGATIVE  NEGATIVE mg/dL   Protein, ur 30 (*) NEGATIVE mg/dL   Urobilinogen, UA 0.2  0.0 - 1.0 mg/dL   Nitrite NEGATIVE  NEGATIVE   Leukocytes, UA LARGE (*) NEGATIVE  URINE MICROSCOPIC-ADD ON      Result Value Ref Range   WBC, UA TOO NUMEROUS TO COUNT  <3 WBC/hpf   Bacteria, UA MANY (*) RARE   Urine-Other MICROSCOPIC EXAM PERFORMED ON UNCONCENTRATED URINE     Imaging Review Dg Chest Port 1 View  01/26/2014   CLINICAL DATA:  Hypoxia.  Nausea and vomiting.  Diarrhea.  EXAM: PORTABLE  CHEST - 1 VIEW  COMPARISON:  DG CHEST 1 VIEW dated 10/13/2013; DG CHEST 2 VIEW  dated 04/05/2013; DG CHEST 2 VIEW dated 04/03/2013; CT ANGIO CHEST W/CM &/OR WO/CM dated 01/09/2011  FINDINGS: Cardiac silhouette moderately enlarged but stable. Thoracic aorta tortuous and atherosclerotic. Mildly prominent bronchovascular markings diffusely, unchanged. Lungs otherwise clear. No localized airspace consolidation. No pleural effusions. No pneumothorax. Normal pulmonary vascularity.  IMPRESSION: Stable cardiomegaly. No acute cardiopulmonary disease.   Electronically Signed   By: Hulan Saas M.D.   On: 01/26/2014 06:25   Images viewed by me.  MDM   Final diagnoses:  Vomiting and diarrhea  Elevated BUN  Dementia    Vomiting and diarrhea which is probably a gastroenteritis. While examining her, oxygen saturation stabilized at 88% on room air. She was placed on nasal oxygen. She does not have oxygen at home. Because of recent lethargy, and urinalysis will be checked for occult urinary tract infection, and chest x-ray will be obtained to rule out occult pneumonia. Old records are reviewed and she was seen recently for a urinary tract infection with Klebsiella.  Urinalysis is positive for ongoing urinary tract infection versus new infection. Chest x-ray is unremarkable. Laboratory workup shows significant elevation of BUN relative to creatinine, but essentially unchanged from value from 3 weeks ago. She's given additional IV fluid and will be discharged with prescription for cephalexin and ondansetron. Family states that they wish to have her placed on hospice. She will be kept in the ED for social service consult and discharged following that.  Dione Booze, MD 01/26/14 [phone]

## 2014-01-27 LAB — BASIC METABOLIC PANEL
BUN: 38 mg/dL — ABNORMAL HIGH (ref 6–23)
CHLORIDE: 105 meq/L (ref 96–112)
CO2: 19 mEq/L (ref 19–32)
Calcium: 7.8 mg/dL — ABNORMAL LOW (ref 8.4–10.5)
Creatinine, Ser: 0.82 mg/dL (ref 0.50–1.10)
GFR calc Af Amer: 68 mL/min — ABNORMAL LOW (ref >90)
GFR, EST NON AFRICAN AMERICAN: 59 mL/min — AB (ref >90)
Glucose, Bld: 188 mg/dL — ABNORMAL HIGH (ref 70–99)
Potassium: 3.2 mEq/L — ABNORMAL LOW (ref 3.7–5.3)
Sodium: 136 mEq/L — ABNORMAL LOW (ref 137–147)

## 2014-01-27 LAB — CBC
HEMATOCRIT: 26.9 % — AB (ref 36.0–46.0)
HEMOGLOBIN: 8.6 g/dL — AB (ref 12.0–15.0)
MCH: 25.1 pg — ABNORMAL LOW (ref 26.0–34.0)
MCHC: 32 g/dL (ref 30.0–36.0)
MCV: 78.4 fL (ref 78.0–100.0)
Platelets: 235 10*3/uL (ref 150–400)
RBC: 3.43 MIL/uL — ABNORMAL LOW (ref 3.87–5.11)
RDW: 14.5 % (ref 11.5–15.5)
WBC: 5 10*3/uL (ref 4.0–10.5)

## 2014-01-27 LAB — GLUCOSE, CAPILLARY
Glucose-Capillary: 155 mg/dL — ABNORMAL HIGH (ref 70–99)
Glucose-Capillary: 107 mg/dL — ABNORMAL HIGH (ref 70–99)
Glucose-Capillary: 147 mg/dL — ABNORMAL HIGH (ref 70–99)

## 2014-01-27 LAB — CLOSTRIDIUM DIFFICILE BY PCR: Toxigenic C. Difficile by PCR: NEGATIVE

## 2014-01-27 MED ORDER — POTASSIUM CHLORIDE 20 MEQ/15ML (10%) PO LIQD
40.0000 meq | Freq: Two times a day (BID) | ORAL | Status: AC
  Administered 2014-01-27 (×2): 40 meq via ORAL
  Filled 2014-01-27 (×2): qty 30

## 2014-01-27 MED ORDER — SODIUM CHLORIDE 0.9 % IV BOLUS (SEPSIS)
500.0000 mL | Freq: Once | INTRAVENOUS | Status: AC
  Administered 2014-01-27: 500 mL via INTRAVENOUS

## 2014-01-27 MED ORDER — BISMUTH SUBSALICYLATE 262 MG/15ML PO SUSP
30.0000 mL | ORAL | Status: DC | PRN
  Administered 2014-01-27: 30 mL via ORAL
  Filled 2014-01-27: qty 236

## 2014-01-27 NOTE — Progress Notes (Signed)
Morning blood pressure 76/41.  On call provider, Dr. Clelia CroftShaw notified.  Responded call to take manual blood pressure, if SBP <90 give 500 ml NS IV bolus per telephone order with read back.  Manuel taken 83/52.  Order placed and IV bolus administered. Will pass on to oncoming RN.

## 2014-01-27 NOTE — Progress Notes (Signed)
Subjective: Much more alert and energetic.  Denies pain.  Flexiseal in place with some pasty consistency of stool per RN. Diarrhea seems to be slowing down.  Objective: Vital signs in last 24 hours: Temp:  [97.8 F (36.6 C)-99 F (37.2 C)] 97.8 F (36.6 C) (03/22 40980608) Pulse Rate:  [77-108] 77 (03/22 0750) Resp:  [16-20] 16 (03/22 0608) BP: (76-127)/(41-63) 114/46 mmHg (03/22 0750) SpO2:  [93 %-100 %] 97 % (03/22 0608) Weight:  [44.4 kg (97 lb 14.2 oz)] 44.4 kg (97 lb 14.2 oz) (03/21 2302) Weight change:  Last BM Date: 01/26/14  CBG (last 3)  No results found for this basename: GLUCAP,  in the last 72 hours  Intake/Output from previous day: 03/21 0701 - 03/22 0700 In: 1170.8 [I.V.:170.8; IV Piggyback:1000] Out: -  Intake/Output this shift: Total I/O In: 140 [P.O.:120; Other:20] Out: -   General appearance: alert and fiesty- tries to push me away while examining her; disoriented (baseline) Eyes: no scleral icterus Throat: oropharynx moist without erythema Resp: clear to auscultation bilaterally Cardio: regular rate and rhythm GI: soft, non-tender; bowel sounds normal; no masses,  no organomegaly Extremities: no clubbing, cyanosis or edema   Lab Results:  Recent Labs  01/26/14 1955 01/27/14 0405  NA 135* 136*  K 3.7 3.2*  CL 99 105  CO2 20 19  GLUCOSE 209* 188*  BUN 44* 38*  CREATININE 0.91 0.82  CALCIUM 8.9 7.8*    Recent Labs  01/26/14 1955  AST 14  ALT 7  ALKPHOS 60  BILITOT 0.3  PROT 6.6  ALBUMIN 2.6*    Recent Labs  01/26/14 0658 01/26/14 1955 01/27/14 0405  WBC 13.6* 5.1 5.0  NEUTROABS 11.4* 3.3  --   HGB 11.3* 10.8* 8.6*  HCT 35.2* 33.6* 26.9*  MCV 78.2 78.0 78.4  PLT 288 319 235   Lab Results  Component Value Date   INR 1.01 10/13/2013   No results found for this basename: CKTOTAL, CKMB, CKMBINDEX, TROPONINI,  in the last 72 hours No results found for this basename: TSH, T4TOTAL, FREET3, T3FREE, THYROIDAB,  in the last 72  hours No results found for this basename: VITAMINB12, FOLATE, FERRITIN, TIBC, IRON, RETICCTPCT,  in the last 72 hours  Studies/Results: Dg Chest Port 1 View  01/26/2014   CLINICAL DATA:  Hypoxia.  Nausea and vomiting.  Diarrhea.  EXAM: PORTABLE CHEST - 1 VIEW  COMPARISON:  DG CHEST 1 VIEW dated 10/13/2013; DG CHEST 2 VIEW dated 04/05/2013; DG CHEST 2 VIEW dated 04/03/2013; CT ANGIO CHEST W/CM &/OR WO/CM dated 01/09/2011  FINDINGS: Cardiac silhouette moderately enlarged but stable. Thoracic aorta tortuous and atherosclerotic. Mildly prominent bronchovascular markings diffusely, unchanged. Lungs otherwise clear. No localized airspace consolidation. No pleural effusions. No pneumothorax. Normal pulmonary vascularity.  IMPRESSION: Stable cardiomegaly. No acute cardiopulmonary disease.   Electronically Signed   By: Hulan Saashomas  Lawrence M.D.   On: 01/26/2014 06:25   Dg Abd 2 Views  01/26/2014   CLINICAL DATA:  Abdominal pain and diarrhea.  EXAM: ABDOMEN - 2 VIEW  COMPARISON:  None.  FINDINGS: No evidence of dilated bowel loops. Scattered air-fluid levels are seen in both small bowel and colon, which are nonspecific. No evidence of free air. No definite radiopaque calculi visualized although several pelvic phleboliths are noted. Left hip prosthesis also noted.  IMPRESSION: Nonspecific, nonobstructive bowel gas pattern.   Electronically Signed   By: Myles RosenthalJohn  Stahl M.D.   On: 01/26/2014 19:52     Medications: Scheduled: . sodium chloride  Intravenous STAT  . cefTRIAXone (ROCEPHIN)  IV  1 g Intravenous Q24H  . divalproex  250 mg Oral QHS  . enoxaparin (LOVENOX) injection  30 mg Subcutaneous Q24H  . insulin aspart  0-9 Units Subcutaneous TID WC  . pantoprazole  40 mg Oral Daily   Continuous: . sodium chloride      Assessment/Plan: Active Problems:  1. Dehydration secondary to Diarrhea- C. Diff negative.  Likely viral gastroenteritis (suspect Norovirus) .  Stool cultures pending. We'll hold off on antimicrobial  treatment pending these results. Advance to full liquid diet. Kaopectate as needed.  2. [place] to dehydration. Out of bed to chair. 3.Urinary tract infection- will treat with Rocephin given increased lethargy. Urine culture pending.  4. Alzheimer's dementia, severe-she is approaching end-stage Alzheimer's disease and is appropriate for hospice care.  5. Diabetes mellitus-we'll hold metformin due to volume depletion and risk of acidosis and cover with sliding scale insulin.  6. Protein-calorie malnutrition, severe-will try to add supplements when she is tolerating a diet 7. Aortic stenosis, severe-Reviewed prior echocardiogram. Discussed risks of sudden cardiac event in the setting of volume depletion with her son.  8. Burnadette Peter is DO NOT RESUSCITATE and will transition to hospice therapy. Hopefully, this can help limit need for recurrent hospitalizations.   LOS: 1 day   Martha Clan 01/27/2014, 11:12 AM

## 2014-01-27 NOTE — Progress Notes (Signed)
UR completed 

## 2014-01-28 DIAGNOSIS — N179 Acute kidney failure, unspecified: Secondary | ICD-10-CM

## 2014-01-28 LAB — CBC
HCT: 27.5 % — ABNORMAL LOW (ref 36.0–46.0)
HEMOGLOBIN: 8.6 g/dL — AB (ref 12.0–15.0)
MCH: 25 pg — AB (ref 26.0–34.0)
MCHC: 31.3 g/dL (ref 30.0–36.0)
MCV: 79.9 fL (ref 78.0–100.0)
Platelets: 229 10*3/uL (ref 150–400)
RBC: 3.44 MIL/uL — AB (ref 3.87–5.11)
RDW: 14.8 % (ref 11.5–15.5)
WBC: 6.9 10*3/uL (ref 4.0–10.5)

## 2014-01-28 LAB — BASIC METABOLIC PANEL
BUN: 19 mg/dL (ref 6–23)
CO2: 18 mEq/L — ABNORMAL LOW (ref 19–32)
Calcium: 8.5 mg/dL (ref 8.4–10.5)
Chloride: 111 mEq/L (ref 96–112)
Creatinine, Ser: 0.75 mg/dL (ref 0.50–1.10)
GFR calc Af Amer: 81 mL/min — ABNORMAL LOW (ref >90)
GFR, EST NON AFRICAN AMERICAN: 70 mL/min — AB (ref >90)
Glucose, Bld: 169 mg/dL — ABNORMAL HIGH (ref 70–99)
POTASSIUM: 4.4 meq/L (ref 3.7–5.3)
SODIUM: 142 meq/L (ref 137–147)

## 2014-01-28 LAB — URINE CULTURE

## 2014-01-28 LAB — GLUCOSE, CAPILLARY
Glucose-Capillary: 155 mg/dL — ABNORMAL HIGH (ref 70–99)
Glucose-Capillary: 137 mg/dL — ABNORMAL HIGH (ref 70–99)

## 2014-01-28 MED ORDER — CIPROFLOXACIN HCL 500 MG PO TABS: 500.0000 mg | ORAL_TABLET | Freq: Two times a day (BID) | ORAL | Status: DC

## 2014-01-28 MED ORDER — BISMUTH SUBSALICYLATE 262 MG/15ML PO SUSP: 30.0000 mL | ORAL | Status: DC | PRN

## 2014-01-28 NOTE — Progress Notes (Addendum)
ADDENDUM: notified that patient/family may be using 'managed care services' paid for through a Medicare benefit - have spoken with pt's husband who stated yes Medicare pays for 'some of this aide service' and he pays for the rest to have 24/7 coverage for his wife's needs at home - if this is trough Personal Care Services per federal regulations Medicare will not cover both PCS and hospice services - spoke with Mr Levada SchillingSummers who will have the person providing services [facility] @ [phone] - also left a Voice Message for [facility] [place] with above information -[facility] will also follow up Valente DavidMargie Indiana Pechacek, RN  Notified pt/family requesting services of [facility]) at discharge information has been faxed to [facility]; spoke briefly with son and husband at bedside; who are aware pt will need [facility] as pt is going into 4th Medicare Hospice Benefit period; [facility] aware [facility] will contact to schedule visit either today or tomorrow - per family pt will discharge by transport today; and they have 24/7 assistance in the home. Valente DavidMargie Raji Glinski, RN [facility] Hospital Liaison [phone]

## 2014-01-28 NOTE — Care Management Note (Signed)
Cm spoke with patient, spouse, and adult son at bedside to confirm home hospice choice. Per spouse and son [facility] to provide home hospice services upon dc. Weekend CM faxed referral to agency on 01/27/14. This CM faxed Md order and dc summary to agency at [phone]. Confirmation received. Per family patient has hospital bed and rw at home. No other dme requested at this time. Pt will require transportation home. CSW notified.    Roxy Mannsymeeka Jaleen Finch,MSN,RN [phone]

## 2014-01-28 NOTE — Discharge Summary (Signed)
DISCHARGE SUMMARY  Jerene CannyLydia S Gerdeman  MR#: [id number]  DOB:01-26-1916  Date of Admission: 01/26/2014 Date of Discharge: 01/28/2014  Attending Physician:Wesam Gearhart, Chrissie NoaWilliam  Patient's WUJ:[id number]:Kylyn Sookram, Chrissie NoaWilliam, MD  Consults:  None  Discharge Diagnoses: Principal Problem:   Diarrhea Active Problems:   Dehydration   Generalized Weakness   Acute renal failure secondary to prerenal azotemia   Hypokalemia   Urinary tract infection- Klebsiella   Hypotension, resolved   Alzheimer's dementia, severe   Diabetes mellitus, type 2   Protein-calorie malnutrition, severe   Aortic stenosis, severe    Past Medical History:  Severe Alzheimer's Dementia  DM2  HTN  Hyperlipidemia  Hypothyroidism Vitamin D deficiency.  Osteoporosis with T12 compression fracture, left hip fracture (12/14)  Severe AS (12/14)- AVA 1.15 cm2   Past Surgical History   Procedure  Laterality  Date   .  Abdominal hysterectomy   1946     partial   .  Hip arthroplasty  Left  10/14/2013     Procedure: ARTHROPLASTY BIPOLAR HIP; Surgeon: Nadara MustardMarcus V Duda, MD; Location: [facility]; Service: Orthopedics; Laterality: Left;      Discharge Medications:   Medication List    STOP taking these medications       cephALEXin 500 MG capsule  Commonly known as:  KEFLEX     IMODIUM MULTI-SYMPTOM RELIEF PO     metFORMIN 500 MG tablet  Commonly known as:  GLUCOPHAGE     ondansetron 4 MG tablet  Commonly known as:  ZOFRAN      TAKE these medications       bismuth subsalicylate 262 MG/15ML suspension  Commonly known as:  PEPTO BISMOL  Take 30 mLs by mouth every 4 (four) hours as needed for indigestion or diarrhea or loose stools.     ciprofloxacin 500 MG tablet  Commonly known as:  CIPRO  Take 1 tablet (500 mg total) by mouth 2 (two) times daily.     divalproex 125 MG capsule  Commonly known as:  DEPAKOTE SPRINKLE  Take 250 mg by mouth at bedtime.     LORazepam 0.5 MG tablet  Commonly known as:  ATIVAN  Take 0.5 mg by mouth every  8 (eight) hours as needed for anxiety (anxiety).     omeprazole 20 MG capsule  Commonly known as:  PRILOSEC  Take 20 mg by mouth daily.     OVER THE COUNTER MEDICATION  Take 1 Can by mouth daily. premier protein     traMADol 50 MG tablet  Commonly known as:  ULTRAM  Take 50 mg by mouth every 6 (six) hours as needed for moderate pain.        Hospital Procedures: Dg Chest Port 1 View  01/26/2014   CLINICAL DATA:  Hypoxia.  Nausea and vomiting.  Diarrhea.  EXAM: PORTABLE CHEST - 1 VIEW  COMPARISON:  DG CHEST 1 VIEW dated 10/13/2013; DG CHEST 2 VIEW dated 04/05/2013; DG CHEST 2 VIEW dated 04/03/2013; CT ANGIO CHEST W/CM &/OR WO/CM dated 01/09/2011  FINDINGS: Cardiac silhouette moderately enlarged but stable. Thoracic aorta tortuous and atherosclerotic. Mildly prominent bronchovascular markings diffusely, unchanged. Lungs otherwise clear. No localized airspace consolidation. No pleural effusions. No pneumothorax. Normal pulmonary vascularity.  IMPRESSION: Stable cardiomegaly. No acute cardiopulmonary disease.   Electronically Signed   By: Hulan Saashomas  Lawrence M.D.   On: 01/26/2014 06:25   Dg Abd 2 Views  01/26/2014   CLINICAL DATA:  Abdominal pain and diarrhea.  EXAM: ABDOMEN - 2 VIEW  COMPARISON:  None.  FINDINGS:  No evidence of dilated bowel loops. Scattered air-fluid levels are seen in both small bowel and colon, which are nonspecific. No evidence of free air. No definite radiopaque calculi visualized although several pelvic phleboliths are noted. Left hip prosthesis also noted.  IMPRESSION: Nonspecific, nonobstructive bowel gas pattern.   Electronically Signed   By: Myles Rosenthal M.D.   On: 01/26/2014 19:52    History of Present Illness: Ms. Hargett is a 80 year old white female with a history of severe Alzheimer's dementia, severe aortic stenosis, and diabetes mellitus type 2 who presented to the emergency department twice within the last 24 hours with the complaint of profuse diarrhea. The patient has  severe dementia and is unable to provide any history. Her son states that she was in her usual state of health until last couple days when she became lethargic and less interactive. By mouth intake has been poor. This morning she awoke around 4 AM with nausea and vomiting on several occasions. She was brought to the emergency department at that time and underwent laboratory evaluation that showed mild dehydration. She was given IV fluids and return home. Since returning home she has had profuse watery diarrhea. He believes that she's had at least 20 bowel movements since she left the emergency room this morning and was becoming dehydrated. She was brought back to the emergency department where her BUN is mildly elevated at 44, creatinine 0.9. Electrolytes are relatively normal. KUB shows no acute abdominal findings. Given the severity of her diarrhea she is being admitted for further management.   At baseline, the patient is minimally verbally interactive with a limited functional status. Requires assistance with transfers to her wheelchair which she spends most of the day. She suffered a hip fracture in 12/14 and spent 40 days at a skilled nursing facility prior to returning home with 24-hour assistance. She has been enrolled in hospice in the past but was discharged to 2 stabilization of her dementia. Her son states that social work was assisting with hospice referral this morning from the emergency department.   Hospital Course: Ms. Baratta was admitted to a medical bed. She was hydrated with normal saline for dehydration. A flexiseal was placed due to severe diarrhea.  C. difficile PCR was negative. Stool cultures are pending.  With hydration, her mental status improved significantly and she is now much more interactive though remained severely demented. She had transient hypotension which is resolved with hydration. She is tolerating a full liquid diet with decreased stool output. Her flexiseal will be  discontinued this morning and if she tolerates her breakfast and lunch without significant diarrhea we will plan for discharge after lunch. She did have a drop in hemoglobin with hydration consistent with dilution effect. This will be rechecked this morning prior to discharge. She had mild acute renal failure in the setting of prerenal azotemia-her BUN and creatinine have improved from 44 and 0.91 to 19 and 0.75 respectively consistent with resolving dehydration.  She did have a UTI on admission. Urine cultures are growing Klebsiella sensitive to ampicillin and Cipro. She was treated with Rocephin in the hospital and would be transitioned to Cipro to complete a seven-day treatment.  Due to her diarrhea, her metformin was held. We will continue to hold this for the next week until she is reevaluated in the office. Her sugars have been under reasonable control and covered by sliding scale insulin.  Day of Discharge Exam BP 132/64  Pulse 76  Temp(Src) 98.4 F (36.9 C) (Oral)  Resp 15  Ht 5' (1.524 m)  Wt 44.4 kg (97 lb 14.2 oz)  BMI 19.12 kg/m2  SpO2 98%  Physical Exam: General appearance: alert and apparent dementia Eyes: no scleral icterus Throat: oropharynx moist without erythema Resp: clear to auscultation bilaterally Cardio: regular rate and rhythm and with grade 3/6 SEM GI: soft, non-tender; bowel sounds normal; no masses,  no organomegaly Extremities: no clubbing, cyanosis or edema  Discharge Labs:  Recent Labs  01/27/14 0405 01/28/14 0430  NA 136* 142  K 3.2* 4.4  CL 105 111  CO2 19 18*  GLUCOSE 188* 169*  BUN 38* 19  CREATININE 0.82 0.75  CALCIUM 7.8* 8.5    Recent Labs  01/26/14 1955  AST 14  ALT 7  ALKPHOS 60  BILITOT 0.3  PROT 6.6  ALBUMIN 2.6*    Recent Labs  01/26/14 0658 01/26/14 1955 01/27/14 0405  WBC 13.6* 5.1 5.0  NEUTROABS 11.4* 3.3  --   HGB 11.3* 10.8* 8.6*  HCT 35.2* 33.6* 26.9*  MCV 78.2 78.0 78.4  PLT 288 319 235   Lab Results   Component Value Date   INR 1.01 10/13/2013    Discharge instructions:     Discharge Orders   Future Orders Complete By Expires   Diet general  As directed    Discharge instructions  As directed    Comments:     Use Kaopectate as needed for diarrhea.  Recommend BRAT diet (Bananas, Rice, Applesauce, Toast) until diarrhea resolves.  Push fluids (Pedialite, Gatorade).  Call if she is getting dehydrated.   Increase activity slowly  As directed       Disposition: to home with husband and 24 hour care  Follow-up Appts: Follow-up with Dr. Clelia Croft at [facility] in 1 week.  Our office will call for appointment.  Condition on Discharge: improved, chronically ill  Tests Needing Follow-up: repeat BMET, CBC  Code Status: DNR  Time with discharge activities: 35 minutes  Signed: Martha Clan 01/28/2014, 8:40 AM

## 2014-01-28 NOTE — Progress Notes (Signed)
CSW received referral to arranged home with hospice care.  CSW notified RNCM as RNCM assist with home hospice.  Inappropriate CSW referral.  CSW signed off.   Please re-consult if social work needs arise.   Loletta SpecterSuzanna Kidd, MSW, LCSW Clinical Social Work [phone]

## 2014-01-28 NOTE — Progress Notes (Signed)
Pt for discharge home with hospice and CSW received notification that pt needing ambulance transport home.  CSW confirmed pt address with pt caregiver at bedside.  CSW arranged ambulance transportation for pt to home address via [facility]. (Service Request ID#: [id number]).  No further social work needs identified at this time.  CSW signing off.   Loletta SpecterSuzanna Ryne Mctigue, MSW, LCSW Clinical Social Work [phone]

## 2014-01-30 LAB — STOOL CULTURE

## 2014-09-09 ENCOUNTER — Telehealth: Payer: Self-pay | Admitting: Internal Medicine

## 2014-09-09 NOTE — Telephone Encounter (Signed)
[name] drop of papers from Hospice that the pt needs filled out. She said that she spoke with Dr Alphonsus SiasLetvak about this. Papers placed on Dee's desk to then give to Dr Alphonsus SiasLetvak. Thank you

## 2014-09-09 NOTE — Telephone Encounter (Signed)
Form done No charge 

## 2014-09-10 NOTE — Telephone Encounter (Signed)
I spoke with Erasmo ScoreBecky Tillman and notified her form is ready to be picked up.

## 2014-09-20 ENCOUNTER — Inpatient Hospital Stay (HOSPITAL_COMMUNITY)
Admission: EM | Admit: 2014-09-20 | Discharge: 2014-09-23 | DRG: 091 | Disposition: A | Discharge status: Hospice | Attending: Internal Medicine | Admitting: Internal Medicine

## 2014-09-20 ENCOUNTER — Encounter (HOSPITAL_COMMUNITY): Payer: Self-pay | Admitting: Nurse Practitioner

## 2014-09-20 DIAGNOSIS — G92 Toxic encephalopathy: Secondary | ICD-10-CM | POA: Diagnosis not present

## 2014-09-20 DIAGNOSIS — Z7401 Bed confinement status: Secondary | ICD-10-CM

## 2014-09-20 DIAGNOSIS — R532 Functional quadriplegia: Secondary | ICD-10-CM | POA: Diagnosis present

## 2014-09-20 DIAGNOSIS — N390 Urinary tract infection, site not specified: Secondary | ICD-10-CM | POA: Diagnosis present

## 2014-09-20 DIAGNOSIS — Z792 Long term (current) use of antibiotics: Secondary | ICD-10-CM

## 2014-09-20 DIAGNOSIS — E876 Hypokalemia: Secondary | ICD-10-CM | POA: Diagnosis present

## 2014-09-20 DIAGNOSIS — Z823 Family history of stroke: Secondary | ICD-10-CM

## 2014-09-20 DIAGNOSIS — E43 Unspecified severe protein-calorie malnutrition: Secondary | ICD-10-CM | POA: Diagnosis present

## 2014-09-20 DIAGNOSIS — Z794 Long term (current) use of insulin: Secondary | ICD-10-CM

## 2014-09-20 DIAGNOSIS — Z515 Encounter for palliative care: Secondary | ICD-10-CM

## 2014-09-20 DIAGNOSIS — E1165 Type 2 diabetes mellitus with hyperglycemia: Secondary | ICD-10-CM | POA: Diagnosis present

## 2014-09-20 DIAGNOSIS — B3749 Other urogenital candidiasis: Secondary | ICD-10-CM | POA: Diagnosis present

## 2014-09-20 DIAGNOSIS — Z79899 Other long term (current) drug therapy: Secondary | ICD-10-CM

## 2014-09-20 DIAGNOSIS — E118 Type 2 diabetes mellitus with unspecified complications: Secondary | ICD-10-CM

## 2014-09-20 DIAGNOSIS — R4182 Altered mental status, unspecified: Secondary | ICD-10-CM | POA: Diagnosis not present

## 2014-09-20 DIAGNOSIS — N179 Acute kidney failure, unspecified: Secondary | ICD-10-CM | POA: Diagnosis present

## 2014-09-20 DIAGNOSIS — E860 Dehydration: Secondary | ICD-10-CM | POA: Diagnosis present

## 2014-09-20 DIAGNOSIS — E870 Hyperosmolality and hypernatremia: Secondary | ICD-10-CM | POA: Diagnosis present

## 2014-09-20 DIAGNOSIS — A419 Sepsis, unspecified organism: Secondary | ICD-10-CM

## 2014-09-20 DIAGNOSIS — R011 Cardiac murmur, unspecified: Secondary | ICD-10-CM | POA: Diagnosis present

## 2014-09-20 DIAGNOSIS — IMO0002 Reserved for concepts with insufficient information to code with codable children: Secondary | ICD-10-CM

## 2014-09-20 DIAGNOSIS — I10 Essential (primary) hypertension: Secondary | ICD-10-CM | POA: Diagnosis present

## 2014-09-20 DIAGNOSIS — E079 Disorder of thyroid, unspecified: Secondary | ICD-10-CM | POA: Diagnosis present

## 2014-09-20 DIAGNOSIS — E872 Acidosis: Secondary | ICD-10-CM | POA: Diagnosis present

## 2014-09-20 DIAGNOSIS — Z79891 Long term (current) use of opiate analgesic: Secondary | ICD-10-CM

## 2014-09-20 DIAGNOSIS — Z96649 Presence of unspecified artificial hip joint: Secondary | ICD-10-CM | POA: Diagnosis present

## 2014-09-20 DIAGNOSIS — Z66 Do not resuscitate: Secondary | ICD-10-CM | POA: Diagnosis present

## 2014-09-20 DIAGNOSIS — R739 Hyperglycemia, unspecified: Secondary | ICD-10-CM

## 2014-09-20 DIAGNOSIS — F0390 Unspecified dementia without behavioral disturbance: Secondary | ICD-10-CM | POA: Diagnosis present

## 2014-09-20 LAB — CBC WITH DIFFERENTIAL/PLATELET
Basophils Absolute: 0 10*3/uL (ref 0.0–0.1)
Basophils Relative: 0 % (ref 0–1)
EOS PCT: 0 % (ref 0–5)
Eosinophils Absolute: 0 10*3/uL (ref 0.0–0.7)
HCT: 36 % (ref 36.0–46.0)
Hemoglobin: 10.9 g/dL — ABNORMAL LOW (ref 12.0–15.0)
LYMPHS ABS: 0.8 10*3/uL (ref 0.7–4.0)
Lymphocytes Relative: 7 % — ABNORMAL LOW (ref 12–46)
MCH: 23.9 pg — ABNORMAL LOW (ref 26.0–34.0)
MCHC: 30.3 g/dL (ref 30.0–36.0)
MCV: 78.8 fL (ref 78.0–100.0)
MONOS PCT: 7 % (ref 3–12)
Monocytes Absolute: 0.8 10*3/uL (ref 0.1–1.0)
NEUTROS PCT: 86 % — AB (ref 43–77)
Neutro Abs: 9.8 10*3/uL — ABNORMAL HIGH (ref 1.7–7.7)
Platelets: 302 10*3/uL (ref 150–400)
RBC: 4.57 MIL/uL (ref 3.87–5.11)
RDW: 15.7 % — ABNORMAL HIGH (ref 11.5–15.5)
WBC: 11.4 10*3/uL — ABNORMAL HIGH (ref 4.0–10.5)

## 2014-09-20 LAB — CBG MONITORING, ED: Glucose-Capillary: >600 mg/dL (ref 70–99)

## 2014-09-20 MED ORDER — SODIUM CHLORIDE 0.9 % IV BOLUS (SEPSIS)
1000.0000 mL | Freq: Once | INTRAVENOUS | Status: AC
  Administered 2014-09-20: 1000 mL via INTRAVENOUS

## 2014-09-20 NOTE — ED Notes (Signed)
Pt presented from home via EMS, pt on hospice care, bed ridden, report of recently being taken off glipizide by her care team for DM type 2. Family report change in mental status from her dementia baseline. EMS glucometer reports High. Pt lethargic at this time.

## 2014-09-20 NOTE — ED Notes (Signed)
Bed: WA06 Expected date:  Expected time:  Means of arrival:  Comments: EMS 35F BS high

## 2014-09-21 ENCOUNTER — Emergency Department (HOSPITAL_COMMUNITY)

## 2014-09-21 DIAGNOSIS — G92 Toxic encephalopathy: Secondary | ICD-10-CM | POA: Diagnosis present

## 2014-09-21 DIAGNOSIS — Z79891 Long term (current) use of opiate analgesic: Secondary | ICD-10-CM | POA: Diagnosis not present

## 2014-09-21 DIAGNOSIS — Z792 Long term (current) use of antibiotics: Secondary | ICD-10-CM | POA: Diagnosis not present

## 2014-09-21 DIAGNOSIS — R40243 Glasgow coma scale score 3-8: Secondary | ICD-10-CM

## 2014-09-21 DIAGNOSIS — E870 Hyperosmolality and hypernatremia: Secondary | ICD-10-CM | POA: Diagnosis present

## 2014-09-21 DIAGNOSIS — G9340 Encephalopathy, unspecified: Secondary | ICD-10-CM

## 2014-09-21 DIAGNOSIS — Z823 Family history of stroke: Secondary | ICD-10-CM | POA: Diagnosis not present

## 2014-09-21 DIAGNOSIS — E876 Hypokalemia: Secondary | ICD-10-CM | POA: Diagnosis present

## 2014-09-21 DIAGNOSIS — E860 Dehydration: Secondary | ICD-10-CM | POA: Diagnosis present

## 2014-09-21 DIAGNOSIS — Z66 Do not resuscitate: Secondary | ICD-10-CM | POA: Diagnosis present

## 2014-09-21 DIAGNOSIS — Z79899 Other long term (current) drug therapy: Secondary | ICD-10-CM | POA: Diagnosis not present

## 2014-09-21 DIAGNOSIS — E079 Disorder of thyroid, unspecified: Secondary | ICD-10-CM | POA: Diagnosis present

## 2014-09-21 DIAGNOSIS — Z96649 Presence of unspecified artificial hip joint: Secondary | ICD-10-CM | POA: Diagnosis present

## 2014-09-21 DIAGNOSIS — E872 Acidosis: Secondary | ICD-10-CM | POA: Diagnosis present

## 2014-09-21 DIAGNOSIS — F0390 Unspecified dementia without behavioral disturbance: Secondary | ICD-10-CM | POA: Diagnosis present

## 2014-09-21 DIAGNOSIS — R011 Cardiac murmur, unspecified: Secondary | ICD-10-CM | POA: Diagnosis present

## 2014-09-21 DIAGNOSIS — R4182 Altered mental status, unspecified: Secondary | ICD-10-CM | POA: Diagnosis present

## 2014-09-21 DIAGNOSIS — Z7401 Bed confinement status: Secondary | ICD-10-CM | POA: Diagnosis not present

## 2014-09-21 DIAGNOSIS — B3749 Other urogenital candidiasis: Secondary | ICD-10-CM | POA: Diagnosis present

## 2014-09-21 DIAGNOSIS — N390 Urinary tract infection, site not specified: Secondary | ICD-10-CM | POA: Diagnosis present

## 2014-09-21 DIAGNOSIS — N179 Acute kidney failure, unspecified: Secondary | ICD-10-CM

## 2014-09-21 DIAGNOSIS — Z794 Long term (current) use of insulin: Secondary | ICD-10-CM | POA: Diagnosis not present

## 2014-09-21 DIAGNOSIS — Z515 Encounter for palliative care: Secondary | ICD-10-CM | POA: Diagnosis not present

## 2014-09-21 DIAGNOSIS — E1165 Type 2 diabetes mellitus with hyperglycemia: Secondary | ICD-10-CM | POA: Diagnosis present

## 2014-09-21 DIAGNOSIS — R532 Functional quadriplegia: Secondary | ICD-10-CM | POA: Diagnosis present

## 2014-09-21 DIAGNOSIS — I10 Essential (primary) hypertension: Secondary | ICD-10-CM | POA: Diagnosis present

## 2014-09-21 DIAGNOSIS — E43 Unspecified severe protein-calorie malnutrition: Secondary | ICD-10-CM | POA: Diagnosis present

## 2014-09-21 LAB — COMPREHENSIVE METABOLIC PANEL
ALK PHOS: 139 U/L — AB (ref 39–117)
ALT: 11 U/L (ref 0–35)
ANION GAP: 17 — AB (ref 5–15)
AST: 13 U/L (ref 0–37)
Albumin: 3.1 g/dL — ABNORMAL LOW (ref 3.5–5.2)
BUN: 64 mg/dL — ABNORMAL HIGH (ref 6–23)
CO2: 20 meq/L (ref 19–32)
Calcium: 9.6 mg/dL (ref 8.4–10.5)
Chloride: 113 mEq/L — ABNORMAL HIGH (ref 96–112)
Creatinine, Ser: 1.79 mg/dL — ABNORMAL HIGH (ref 0.50–1.10)
GFR, EST AFRICAN AMERICAN: 26 mL/min — AB (ref >90)
GFR, EST NON AFRICAN AMERICAN: 23 mL/min — AB (ref >90)
GLUCOSE: 766 mg/dL — AB (ref 70–99)
POTASSIUM: 4.6 meq/L (ref 3.7–5.3)
SODIUM: 150 meq/L — AB (ref 137–147)
Total Protein: 7.5 g/dL (ref 6.0–8.3)

## 2014-09-21 LAB — URINALYSIS, ROUTINE W REFLEX MICROSCOPIC
BILIRUBIN URINE: NEGATIVE
Glucose, UA: 500 mg/dL — AB
KETONES UR: NEGATIVE mg/dL
NITRITE: NEGATIVE
Protein, ur: NEGATIVE mg/dL
Specific Gravity, Urine: 1.021 (ref 1.005–1.030)
Urobilinogen, UA: 0.2 mg/dL (ref 0.0–1.0)
pH: 5 (ref 5.0–8.0)

## 2014-09-21 LAB — CBG MONITORING, ED
Glucose-Capillary: 572 mg/dL (ref 70–99)
Glucose-Capillary: 403 mg/dL — ABNORMAL HIGH (ref 70–99)

## 2014-09-21 LAB — BASIC METABOLIC PANEL
ANION GAP: 13 (ref 5–15)
Anion gap: 16 — ABNORMAL HIGH (ref 5–15)
BUN: 58 mg/dL — AB (ref 6–23)
BUN: 54 mg/dL — ABNORMAL HIGH (ref 6–23)
CALCIUM: 9.2 mg/dL (ref 8.4–10.5)
CO2: 17 mEq/L — ABNORMAL LOW (ref 19–32)
CO2: 19 mEq/L (ref 19–32)
CREATININE: 1.51 mg/dL — AB (ref 0.50–1.10)
CREATININE: 1.42 mg/dL — AB (ref 0.50–1.10)
Calcium: 9.4 mg/dL (ref 8.4–10.5)
Chloride: 121 mEq/L — ABNORMAL HIGH (ref 96–112)
Chloride: 123 mEq/L — ABNORMAL HIGH (ref 96–112)
GFR calc non Af Amer: 30 mL/min — ABNORMAL LOW (ref >90)
GFR, EST AFRICAN AMERICAN: 32 mL/min — AB (ref >90)
GFR, EST AFRICAN AMERICAN: 35 mL/min — AB (ref >90)
GFR, EST NON AFRICAN AMERICAN: 28 mL/min — AB (ref >90)
Glucose, Bld: 238 mg/dL — ABNORMAL HIGH (ref 70–99)
Glucose, Bld: 186 mg/dL — ABNORMAL HIGH (ref 70–99)
POTASSIUM: 4 meq/L (ref 3.7–5.3)
Potassium: 4.4 mEq/L (ref 3.7–5.3)
SODIUM: 155 meq/L — AB (ref 137–147)
Sodium: 154 mEq/L — ABNORMAL HIGH (ref 137–147)

## 2014-09-21 LAB — GLUCOSE, CAPILLARY
GLUCOSE-CAPILLARY: 341 mg/dL — AB (ref 70–99)
GLUCOSE-CAPILLARY: 176 mg/dL — AB (ref 70–99)
GLUCOSE-CAPILLARY: 169 mg/dL — AB (ref 70–99)
GLUCOSE-CAPILLARY: 148 mg/dL — AB (ref 70–99)
Glucose-Capillary: 237 mg/dL — ABNORMAL HIGH (ref 70–99)
Glucose-Capillary: 168 mg/dL — ABNORMAL HIGH (ref 70–99)
Glucose-Capillary: 172 mg/dL — ABNORMAL HIGH (ref 70–99)
Glucose-Capillary: 160 mg/dL — ABNORMAL HIGH (ref 70–99)
Glucose-Capillary: 178 mg/dL — ABNORMAL HIGH (ref 70–99)
Glucose-Capillary: 171 mg/dL — ABNORMAL HIGH (ref 70–99)
Glucose-Capillary: 237 mg/dL — ABNORMAL HIGH (ref 70–99)
Glucose-Capillary: 154 mg/dL — ABNORMAL HIGH (ref 70–99)

## 2014-09-21 LAB — CBC
HEMATOCRIT: 34.1 % — AB (ref 36.0–46.0)
HEMOGLOBIN: 10.2 g/dL — AB (ref 12.0–15.0)
MCH: 23.6 pg — AB (ref 26.0–34.0)
MCHC: 29.9 g/dL — AB (ref 30.0–36.0)
MCV: 78.9 fL (ref 78.0–100.0)
Platelets: 252 10*3/uL (ref 150–400)
RBC: 4.32 MIL/uL (ref 3.87–5.11)
RDW: 15.6 % — ABNORMAL HIGH (ref 11.5–15.5)
WBC: 13.6 10*3/uL — ABNORMAL HIGH (ref 4.0–10.5)

## 2014-09-21 LAB — URINE MICROSCOPIC-ADD ON

## 2014-09-21 LAB — I-STAT CG4 LACTIC ACID, ED: Lactic Acid, Venous: 2.06 mmol/L (ref 0.5–2.2)

## 2014-09-21 MED ORDER — DEXTROSE 5 % IV SOLN: 1.0000 g | INTRAVENOUS | Status: DC

## 2014-09-21 MED ORDER — MAGNESIUM HYDROXIDE 400 MG/5ML PO SUSP: 5.0000 mL | Freq: Every day | ORAL | Status: DC | PRN

## 2014-09-21 MED ORDER — ACETAMINOPHEN 325 MG PO TABS
650.0000 mg | ORAL_TABLET | Freq: Four times a day (QID) | ORAL | Status: DC | PRN
  Administered 2014-09-23: 650 mg via ORAL
  Filled 2014-09-21: qty 2

## 2014-09-21 MED ORDER — INSULIN ASPART 100 UNIT/ML ~~LOC~~ SOLN: 0.0000 [IU] | Freq: Every day | SUBCUTANEOUS | Status: DC

## 2014-09-21 MED ORDER — CEFTRIAXONE SODIUM IN DEXTROSE 20 MG/ML IV SOLN
1.0000 g | Freq: Every day | INTRAVENOUS | Status: DC
  Administered 2014-09-21 – 2014-09-23 (×3): 1 g via INTRAVENOUS
  Filled 2014-09-21 (×3): qty 50

## 2014-09-21 MED ORDER — BISMUTH SUBSALICYLATE 262 MG/15ML PO SUSP
30.0000 mL | ORAL | Status: DC | PRN
  Filled 2014-09-21: qty 236

## 2014-09-21 MED ORDER — DIVALPROEX SODIUM 125 MG PO CPSP
125.0000 mg | ORAL_CAPSULE | Freq: Every day | ORAL | Status: DC
  Administered 2014-09-21 – 2014-09-22 (×2): 125 mg via ORAL
  Filled 2014-09-21 (×3): qty 1

## 2014-09-21 MED ORDER — SODIUM CHLORIDE 0.9 % IV SOLN
INTRAVENOUS | Status: DC
  Filled 2014-09-21: qty 2.5

## 2014-09-21 MED ORDER — INSULIN ASPART 100 UNIT/ML ~~LOC~~ SOLN
0.0000 [IU] | Freq: Three times a day (TID) | SUBCUTANEOUS | Status: DC
  Administered 2014-09-21: 3 [IU] via SUBCUTANEOUS
  Administered 2014-09-21: 2 [IU] via SUBCUTANEOUS
  Administered 2014-09-22: 3 [IU] via SUBCUTANEOUS
  Administered 2014-09-22: 5 [IU] via SUBCUTANEOUS
  Administered 2014-09-23: 2 [IU] via SUBCUTANEOUS

## 2014-09-21 MED ORDER — SODIUM CHLORIDE 0.9 % IV BOLUS (SEPSIS)
1000.0000 mL | Freq: Once | INTRAVENOUS | Status: AC
  Administered 2014-09-21: 1000 mL via INTRAVENOUS

## 2014-09-21 MED ORDER — SODIUM CHLORIDE 0.45 % IV SOLN
INTRAVENOUS | Status: DC
  Administered 2014-09-21: 15:00:00 via INTRAVENOUS
  Administered 2014-09-22: 1000 mL via INTRAVENOUS
  Administered 2014-09-22: 22:00:00 via INTRAVENOUS

## 2014-09-21 MED ORDER — POLYETHYLENE GLYCOL 3350 17 G PO PACK
17.0000 g | PACK | Freq: Every day | ORAL | Status: DC
  Administered 2014-09-22 – 2014-09-23 (×2): 17 g via ORAL
  Filled 2014-09-21 (×3): qty 1

## 2014-09-21 MED ORDER — SODIUM CHLORIDE 0.9 % IV SOLN
INTRAVENOUS | Status: DC
  Administered 2014-09-21: 10.8 [IU]/h via INTRAVENOUS
  Administered 2014-09-21: 5.1 [IU]/h via INTRAVENOUS
  Filled 2014-09-21: qty 2.5

## 2014-09-21 MED ORDER — DEXTROSE-NACL 5-0.45 % IV SOLN
INTRAVENOUS | Status: DC
  Administered 2014-09-21: 06:00:00 via INTRAVENOUS

## 2014-09-21 MED ORDER — DEXTROSE 50 % IV SOLN: 25.0000 mL | INTRAVENOUS | Status: DC | PRN

## 2014-09-21 MED ORDER — SODIUM CHLORIDE 0.45 % IV SOLN
INTRAVENOUS | Status: DC
  Administered 2014-09-21: 06:00:00 via INTRAVENOUS

## 2014-09-21 MED ORDER — CETYLPYRIDINIUM CHLORIDE 0.05 % MT LIQD
7.0000 mL | Freq: Two times a day (BID) | OROMUCOSAL | Status: DC
  Administered 2014-09-21 – 2014-09-23 (×4): 7 mL via OROMUCOSAL

## 2014-09-21 MED ORDER — INSULIN GLARGINE 100 UNIT/ML ~~LOC~~ SOLN
3.0000 [IU] | Freq: Two times a day (BID) | SUBCUTANEOUS | Status: DC
Start: 2014-09-21 — End: 2014-09-23
  Administered 2014-09-21 – 2014-09-23 (×5): 3 [IU] via SUBCUTANEOUS
  Filled 2014-09-21 (×5): qty 0.03

## 2014-09-21 MED ORDER — DEXTROSE 5 % IV SOLN: 2.0000 g | Freq: Once | INTRAVENOUS | Status: DC

## 2014-09-21 MED ORDER — INSULIN REGULAR BOLUS VIA INFUSION
0.0000 [IU] | Freq: Three times a day (TID) | INTRAVENOUS | Status: DC
  Filled 2014-09-21: qty 10

## 2014-09-21 MED ORDER — QUETIAPINE FUMARATE 25 MG PO TABS
25.0000 mg | ORAL_TABLET | Freq: Two times a day (BID) | ORAL | Status: DC
  Administered 2014-09-21 – 2014-09-23 (×4): 25 mg via ORAL
  Filled 2014-09-21 (×6): qty 1

## 2014-09-21 MED ORDER — PANTOPRAZOLE SODIUM 40 MG PO TBEC
40.0000 mg | DELAYED_RELEASE_TABLET | Freq: Every day | ORAL | Status: DC
  Administered 2014-09-22 – 2014-09-23 (×2): 40 mg via ORAL
  Filled 2014-09-21 (×2): qty 1

## 2014-09-21 MED ORDER — DEXTROSE-NACL 5-0.45 % IV SOLN: INTRAVENOUS | Status: DC

## 2014-09-21 NOTE — H&P (Signed)
[facility] History and Physical  Anne CannyLydia S [name] [id number] DOB: 06-16-1916 DOA: 09/20/2014  Referring physician: EDP PCP: Martha ClanShaw, William, MD   Chief Complaint: Hyperglycemia, AMS   HPI: Anne [name] is a 80 y.o. female currently in hospice care who presents with AMS.  Patient has had essentially no PO intake for the past 3 hours, her baseline dementia has progressed to obtunded.  She has had no PO intake.  Her BGL has read high, and she is moving her L side somewhat now but not her R side.  W/u in ED shows: UTI, hyperglycemia, dehydration, AKI.  Review of Systems: Unable to perform due to AMS.  Past Medical History  Diagnosis Date  . Diabetes mellitus   . Dementia   . Thyroid disease   . Hypertension   . Murmur, heart 10/13/2013   Past Surgical History  Procedure Laterality Date  . Abdominal hysterectomy  1946    partial  . Hip arthroplasty Left 10/14/2013    Procedure: ARTHROPLASTY BIPOLAR HIP;  Surgeon: Nadara MustardMarcus V Duda, MD;  Location: [facility];  Service: Orthopedics;  Laterality: Left;   Social History:  reports that she has never smoked. She has never used smokeless tobacco. She reports that she does not drink alcohol or use illicit drugs.  No Known Allergies  Family History  Problem Relation Age of Onset  . Stroke Mother   . Other Father     accident     Prior to Admission medications   Medication Sig Start Date End Date Taking? Authorizing Provider  acetaminophen (TYLENOL) 325 MG tablet Take 650 mg by mouth every 6 (six) hours as needed for mild pain.   Yes Historical Provider, MD  divalproex (DEPAKOTE SPRINKLE) 125 MG capsule Take 125 mg by mouth at bedtime.  10/24/13  Yes Sharee Holstereborah S Green, NP  insulin regular (NOVOLIN R,HUMULIN R) 100 units/mL injection Inject 5 Units into the skin 3 (three) times daily before meals.   Yes Historical Provider, MD  magnesium hydroxide (MILK OF MAGNESIA) 400 MG/5ML suspension Take 5 mLs by mouth daily as needed for mild  constipation.   Yes Historical Provider, MD  omeprazole (PRILOSEC) 20 MG capsule Take 20 mg by mouth daily.   Yes Historical Provider, MD  OVER THE COUNTER MEDICATION Take 1 Can by mouth daily. Boost   Yes Historical Provider, MD  polyethylene glycol (MIRALAX / GLYCOLAX) packet Take 17 g by mouth daily.   Yes Historical Provider, MD  QUEtiapine (SEROQUEL) 50 MG tablet Take 25 mg by mouth 2 (two) times daily.   Yes Historical Provider, MD  bismuth subsalicylate (PEPTO BISMOL) 262 MG/15ML suspension Take 30 mLs by mouth every 4 (four) hours as needed for indigestion or diarrhea or loose stools. 01/28/14   Martha ClanWilliam Shaw, MD  ciprofloxacin (CIPRO) 500 MG tablet Take 1 tablet (500 mg total) by mouth 2 (two) times daily. 01/28/14   Martha ClanWilliam Shaw, MD  LORazepam (ATIVAN) 0.5 MG tablet Take 0.5 mg by mouth every 8 (eight) hours as needed for anxiety (anxiety).    Historical Provider, MD  traMADol (ULTRAM) 50 MG tablet Take 50 mg by mouth every 6 (six) hours as needed for moderate pain.    Historical Provider, MD   Physical Exam: Filed Vitals:   09/21/14 0100  BP: 105/56  Pulse: 104  Temp:   Resp: 24    BP 105/56 mmHg  Pulse 104  Temp(Src) 97.8 F (36.6 C) (Rectal)  Resp 24  SpO2 89%  General Appearance:  Obtunded, no distress, appears stated age  Head:    Normocephalic, atraumatic  Eyes:    PERRL, cannot test movements, sclera non-icteric        Nose:   Nares without drainage or epistaxis. Mucosa, turbinates normal  Throat:   Moist mucous membranes. Oropharynx without erythema or exudate.  Neck:   Supple. No carotid bruits.  No thyromegaly.  No lymphadenopathy.   Back:     No CVA tenderness, no spinal tenderness  Lungs:     Clear to auscultation bilaterally, without wheezes, rhonchi or rales  Chest wall:    No tenderness to palpitation  Heart:    Regular rate and rhythm, aortic stenosis murmur.  Abdomen:     Soft, non-tender, nondistended, normal bowel sounds, no organomegaly  Genitalia:     deferred  Rectal:    deferred  Extremities:   No clubbing, cyanosis or edema.  Pulses:   2+ and symmetric all extremities  Skin:   Skin color, texture, turgor normal, no rashes or lesions  Lymph nodes:   Cervical, supraclavicular, and axillary nodes normal  Neurologic:   Patient is obtunded and unable to participate in the neurologic exam.    Labs on Admission:  Basic Metabolic Panel:  Recent Labs Lab 09/20/14 2259  NA 150*  K 4.6  CL 113*  CO2 20  GLUCOSE 766*  BUN 64*  CREATININE 1.79*  CALCIUM 9.6   Liver Function Tests:  Recent Labs Lab 09/20/14 2259  AST 13  ALT 11  ALKPHOS 139*  BILITOT <0.2*  PROT 7.5  ALBUMIN 3.1*   No results for input(s): LIPASE, AMYLASE in the last 168 hours. No results for input(s): AMMONIA in the last 168 hours. CBC:  Recent Labs Lab 09/20/14 2259  WBC 11.4*  NEUTROABS 9.8*  HGB 10.9*  HCT 36.0  MCV 78.8  PLT 302   Cardiac Enzymes: No results for input(s): CKTOTAL, CKMB, CKMBINDEX, TROPONINI in the last 168 hours.  BNP (last 3 results) No results for input(s): PROBNP in the last 8760 hours. CBG:  Recent Labs Lab 09/20/14 2319 09/21/14 0108 09/21/14 0225 09/21/14 0334  GLUCAP >600* 572* >600* 403*    Radiological Exams on Admission: Dg Chest Portable 1 View  09/21/2014   CLINICAL DATA:  Initial evaluation Pt presented from home via EMS, pt on hospice care, bed ridden, report of recently being taken off glipizide by her care team for DM type 2. Family report change in mental status from her dementia baseline. EMS glucometer reports High. Pt lethargic at this time.  EXAM: PORTABLE CHEST - 1 VIEW  COMPARISON:  01/26/1949  FINDINGS: Stable mild cardiac enlargement. Stable uncoiling and calcification of the aorta. Vascular pattern is normal. Right lung is clear. Mild left lower lobe atelectasis similar to prior study.  IMPRESSION: No significant acute findings.   Electronically Signed   By: Esperanza Heiraymond  Rubner M.D.   On:  09/21/2014 01:17    EKG: Independently reviewed.  Assessment/Plan Active Problems:   Diabetes mellitus   Acute renal failure   Hyperglycemia due to type 2 diabetes mellitus   UTI (lower urinary tract infection)   Altered mental status   Hypernatremia   1. AMS - toxic-metabolic encephalopathy due to one or more of any of the following conditions: UTI, hyperglycemia, dehydration and hypernatremia with ARF, or possibly even an occult CVA. 2. Hyperglycemia and DM2 - likely out of control due to UTI 1. Insulin gtt 2. Repeat BMP at 0500 3. AKI and hypernatremia due  to dehydration 1. IVF 2. Repeat BMP 3. Monitor intake and output 4. UTI - rocephin, also had some fungal elements, culture pending, will hold off on anti fungals and see how she does with rocephin and treatment of glucosuria for now. 5. Not moving R side - could be part of the AMS or could be due to a new stroke.  I discussed the implications with family, but the patients BP is controlled and if this were to be a stroke causing her symptoms she likely would do very poorly with this.  I offered to CT her head but family after consideration, declined this at this time. 6. DVT ppx - none in this hospice patient.    Code Status: DNR/DNI  Family Communication: Family at bedside Disposition Plan: Admit to inpatient   Time spent: 70 min  Aanvi Voyles M. [facility] Pager [phone]  If 7AM-7PM, please contact the day team taking care of the patient [email or web address] Password [email or web address] 09/21/2014, 4:01 AM

## 2014-09-21 NOTE — Progress Notes (Addendum)
GIP visit Anne PoeLydia [name]  Related admission to hospice diagnosis of Alzheimers. Patient is a DNR. Patient laying in the bed family members at bedside. Patient responds to verbal stimuli but does not open up her eyes. Noted one mitten restraint and patient keeps reaching for her oxygen trying to pull it out of her nose. Patient is on a insulin drip and anitbiotics IV have been given with fluids running. Family states that they had some issues since admission but do not want to discuss it. Family states that the patients UTI was really bad. Family requesting to speak with the physician, nurse notified. Anne HerterShannon [name] has no concerns at this time and Anne [name] has spoke with family about what physician had said this morning in huddle.  Please call with any hospice concerns [phone] Anne RiasErin [name]

## 2014-09-21 NOTE — Progress Notes (Signed)
Pt showing signs of hunger per family and alert enough to try to eat. Ate one cup of yogurt and half cup of jello and tolerated it well. CBGs stable now in 160s and to come off insulin drip soon. Mick SellShannon Cowen Pesqueira RN

## 2014-09-21 NOTE — Progress Notes (Signed)
Spoke with Dr Elisabeth Pigeonevine regarding patient's anion gap and CO2 levels - ok to leave patient off of telemetry as patient is DNR and Hospice patient.

## 2014-09-21 NOTE — Progress Notes (Addendum)
Patient ID: Anne CannyLydia S Halbleib, female   DOB: 04-11-1916, 80 y.o.   MRN: [id number] [facility] PROGRESS NOTE  Anne CannyLydia S Lampley [id number] DOB: 04-11-1916 DOA: 09/20/2014 PCP: Martha ClanShaw, William, MD  Brief narrative: Addendum to admission note 09/21/2014  80 y.o. female currently in hospice care who [facility] with altered mental status. SHe was found to have UTI and hyperglycemia. She is currently on insulin drip.  Assessment/Plan:    Principal Problem: Acute encephalopathy - likely due to UTI, dehydration - continue insulin drip for hyperglycemia - continue IV fluids, 1/2 NS Active Problems: Hyperglycemia / diabetes mellitus controlled - requires insulin drip on this admission - added low dose lantus 5 units BID - monitor CBG's - A1c in 2014 was 6.7 indicating good glycemic control - no need to recheck A1c, pt follows with hospice   Acute renal failure - secondary to dehydration - continue current IV fluids - follow up BMP in am    UTI (lower urinary tract infection) - continue rocephin - follow up urine culture results    Hypernatremia - secondary to dehydration  - continue IV fluids   DVT Prophylaxis   SCD's bilaterally   Code Status: DNR/DNI Family Communication:  plan of care discussed with the patient Disposition Plan: Home when stable.   IV access:   PeripheralIV  Procedures and diagnostic studies:    Dg Chest Portable 1 View 09/21/2014   No significant acute findings.   Electronically Signed   By: Esperanza Heiraymond  Rubner M.D.   On: 09/21/2014 01:17   Medical Consultants:   None  Other Consultants:   None  IAnti-Infectives:    Rocephin 09/21/2014 -->   Manson PasseyEVINE, Ringo Sherod, MD  [facility] Pager [phone]  If 7PM-7AM, please contact night-coverage [email or web address] Password [email or web address] 09/21/2014, 10:54 AM   LOS: 1 day    HPI/Subjective: No acute overnight events.  Objective: Filed Vitals:   09/21/14 0300 09/21/14 0400 09/21/14 0514  09/21/14 0631  BP: 129/112 99/53  101/60  Pulse: 98 97  96  Temp:    98.1 F (36.7 C)  TempSrc:    Oral  Resp: 25 27  22   Weight:   48.2 kg (106 lb 4.2 oz)   SpO2: 94% 93%  91%    Intake/Output Summary (Last 24 hours) at 09/21/14 1054 Last data filed at 09/21/14 0615  Gross per 24 hour  Intake  68.33 ml  Output      0 ml  Net  68.33 ml    Exam:   General:  Pt is sleeping, no distress  Cardiovascular: Regular rate and rhythm, S1/S2 appreciated   Respiratory: no wheezing, no crackles, no rhonchi   Data Reviewed: Basic Metabolic Panel:  Recent Labs Lab 09/20/14 2259 09/21/14 0600  NA 150* 154*  K 4.6 4.0  CL 113* 121*  CO2 20 17*  GLUCOSE 766* 238*  BUN 64* 58*  CREATININE 1.79* 1.51*  CALCIUM 9.6 9.4   Liver Function Tests:  Recent Labs Lab 09/20/14 2259  AST 13  ALT 11  ALKPHOS 139*  BILITOT <0.2*  PROT 7.5  ALBUMIN 3.1*   No results for input(s): LIPASE, AMYLASE in the last 168 hours. No results for input(s): AMMONIA in the last 168 hours. CBC:  Recent Labs Lab 09/20/14 2259 09/21/14 0737  WBC 11.4* 13.6*  NEUTROABS 9.8*  --   HGB 10.9* 10.2*  HCT 36.0 34.1*  MCV 78.8 78.9  PLT 302 252   Cardiac Enzymes: No results for input(s):  CKTOTAL, CKMB, CKMBINDEX, TROPONINI in the last 168 hours. BNP: Invalid input(s): POCBNP CBG:  Recent Labs Lab 09/21/14 0443 09/21/14 0553 09/21/14 0701 09/21/14 0805 09/21/14 0905  GLUCAP 341* 237* 168* 172* 160*    No results found for this or any previous visit (from the past 240 hour(s)).   Scheduled Meds: . antiseptic oral rinse  7 mL Mouth Rinse BID  . cefTRIAXone (ROCEPHIN)  IV  1 g Intravenous Q0600  . divalproex  125 mg Oral QHS  . insulin glargine  3 Units Subcutaneous BID  . insulin regular  0-10 Units Intravenous TID WC  . pantoprazole  40 mg Oral Daily  . polyethylene glycol  17 g Oral Daily  . QUEtiapine  25 mg Oral BID   Continuous Infusions: . sodium chloride Stopped (09/21/14  0615)  . dextrose 5 % and 0.45% NaCl    . dextrose 5 % and 0.45% NaCl 100 mL/hr at 09/21/14 0615  . insulin (NOVOLIN-R) infusion 1.2 Units/hr (09/21/14 1000)

## 2014-09-21 NOTE — Plan of Care (Signed)
Problem: Phase I Progression Outcomes Goal: Voiding-avoid urinary catheter unless indicated Outcome: Progressing     

## 2014-09-21 NOTE — ED Provider Notes (Signed)
CSN: [id number]     Arrival date & time 09/20/14  2144 History   First MD Initiated Contact with Patient 09/20/14 2255     Chief Complaint  Patient presents with  . Hyperglycemia  . Altered Mental Status     (Consider location/radiation/quality/duration/timing/severity/associated sxs/prior Treatment) HPI  This is a 80 year old female with a history of dementia, hypertension, diabetes currently on hospice care who presents with altered mental status. Per the patient's family over the last 24 hours, patient has become aggressively more obtunded.  At baseline, patient is disoriented but woke. On a conversation. She's recently been taking off several of her medications. Family noted that patient's blood sugar was "high" at home today.  Insulin was ordered by hospice but she continued to read high. Patient is DO NOT RESUSCITATE, DO NOT INTUBATE but they would like antibiotics  And any reversible causes of altered mental status treated.  Level V caveat for dementia and altered mental status  Past Medical History  Diagnosis Date  . Diabetes mellitus   . Dementia   . Thyroid disease   . Hypertension   . Murmur, heart 10/13/2013   Past Surgical History  Procedure Laterality Date  . Abdominal hysterectomy  1946    partial  . Hip arthroplasty Left 10/14/2013    Procedure: ARTHROPLASTY BIPOLAR HIP;  Surgeon: Nadara MustardMarcus V Duda, MD;  Location: [facility];  Service: Orthopedics;  Laterality: Left;   Family History  Problem Relation Age of Onset  . Stroke Mother   . Other Father     accident   History  Substance Use Topics  . Smoking status: Never Smoker   . Smokeless tobacco: Never Used  . Alcohol Use: No   OB History    No data available     Review of Systems  Unable to perform ROS: Dementia      Allergies  Review of patient's allergies indicates no known allergies.  Home Medications   Prior to Admission medications   Medication Sig Start Date End Date Taking? Authorizing  Provider  acetaminophen (TYLENOL) 325 MG tablet Take 650 mg by mouth every 6 (six) hours as needed for mild pain.   Yes Historical Provider, MD  divalproex (DEPAKOTE SPRINKLE) 125 MG capsule Take 125 mg by mouth at bedtime.  10/24/13  Yes Sharee Holstereborah S Green, NP  insulin regular (NOVOLIN R,HUMULIN R) 100 units/mL injection Inject 5 Units into the skin 3 (three) times daily before meals.   Yes Historical Provider, MD  magnesium hydroxide (MILK OF MAGNESIA) 400 MG/5ML suspension Take 5 mLs by mouth daily as needed for mild constipation.   Yes Historical Provider, MD  omeprazole (PRILOSEC) 20 MG capsule Take 20 mg by mouth daily.   Yes Historical Provider, MD  OVER THE COUNTER MEDICATION Take 1 Can by mouth daily. Boost   Yes Historical Provider, MD  polyethylene glycol (MIRALAX / GLYCOLAX) packet Take 17 g by mouth daily.   Yes Historical Provider, MD  QUEtiapine (SEROQUEL) 50 MG tablet Take 25 mg by mouth 2 (two) times daily.   Yes Historical Provider, MD  bismuth subsalicylate (PEPTO BISMOL) 262 MG/15ML suspension Take 30 mLs by mouth every 4 (four) hours as needed for indigestion or diarrhea or loose stools. 01/28/14   Martha ClanWilliam Shaw, MD  ciprofloxacin (CIPRO) 500 MG tablet Take 1 tablet (500 mg total) by mouth 2 (two) times daily. 01/28/14   Martha ClanWilliam Shaw, MD  LORazepam (ATIVAN) 0.5 MG tablet Take 0.5 mg by mouth every 8 (eight) hours as  needed for anxiety (anxiety).    Historical Provider, MD  traMADol (ULTRAM) 50 MG tablet Take 50 mg by mouth every 6 (six) hours as needed for moderate pain.    Historical Provider, MD   BP 105/56 mmHg  Pulse 104  Temp(Src) 97.8 F (36.6 C) (Rectal)  Resp 24  SpO2 89% Physical Exam  Constitutional:  Elderly, ill-appearing, no acute distress  HENT:  Head: Normocephalic and atraumatic.  mucus membranes dry  Eyes: Pupils are equal, round, and reactive to light.  Cardiovascular: Normal rate, regular rhythm and normal heart sounds.   No murmur  heard. Pulmonary/Chest: Effort normal and breath sounds normal. No respiratory distress. She has no wheezes.  Abdominal: Soft. Bowel sounds are normal. There is no tenderness. There is no rebound.  Neurological:  Somnolent, minimally arousable, protecting airway at this time, spontaneously moves all 4 extremities  Skin: Skin is warm and dry.  Nursing note and vitals reviewed.   ED Course  Procedures (including critical care time)  CRITICAL CARE Performed by: Ross Marcus, F   Total critical care time: 45 min  Critical care time was exclusive of separately billable procedures and treating other patients.  Critical care was necessary to treat or prevent imminent or life-threatening deterioration.  Critical care was time spent personally by me on the following activities: development of treatment plan with patient and/or surrogate as well as nursing, discussions with consultants, evaluation of patient's response to treatment, examination of patient, obtaining history from patient or surrogate, ordering and performing treatments and interventions, ordering and review of laboratory studies, ordering and review of radiographic studies, pulse oximetry and re-evaluation of patient's condition.  Labs Review Labs Reviewed  CBC WITH DIFFERENTIAL - Abnormal; Notable for the following:    WBC 11.4 (*)    Hemoglobin 10.9 (*)    MCH 23.9 (*)    RDW 15.7 (*)    Neutrophils Relative % 86 (*)    Neutro Abs 9.8 (*)    Lymphocytes Relative 7 (*)    All other components within normal limits  COMPREHENSIVE METABOLIC PANEL - Abnormal; Notable for the following:    Sodium 150 (*)    Chloride 113 (*)    Glucose, Bld 766 (*)    BUN 64 (*)    Creatinine, Ser 1.79 (*)    Albumin 3.1 (*)    Alkaline Phosphatase 139 (*)    Total Bilirubin <0.2 (*)    GFR calc non Af Amer 23 (*)    GFR calc Af Amer 26 (*)    Anion gap 17 (*)    All other components within normal limits  URINALYSIS, ROUTINE W  REFLEX MICROSCOPIC - Abnormal; Notable for the following:    APPearance TURBID (*)    Glucose, UA 500 (*)    Hgb urine dipstick LARGE (*)    Leukocytes, UA LARGE (*)    All other components within normal limits  URINE MICROSCOPIC-ADD ON - Abnormal; Notable for the following:    Bacteria, UA MANY (*)    All other components within normal limits  CBG MONITORING, ED - Abnormal; Notable for the following:    Glucose-Capillary >600 (*)    All other components within normal limits  CBG MONITORING, ED - Abnormal; Notable for the following:    Glucose-Capillary 572 (*)    All other components within normal limits  CULTURE, BLOOD (ROUTINE X 2)  CULTURE, BLOOD (ROUTINE X 2)  URINE CULTURE  I-STAT CG4 LACTIC ACID, ED  Imaging Review Dg Chest Portable 1 View  09/21/2014   CLINICAL DATA:  Initial evaluation Pt presented from home via EMS, pt on hospice care, bed ridden, report of recently being taken off glipizide by her care team for DM type 2. Family report change in mental status from her dementia baseline. EMS glucometer reports High. Pt lethargic at this time.  EXAM: PORTABLE CHEST - 1 VIEW  COMPARISON:  01/26/1949  FINDINGS: Stable mild cardiac enlargement. Stable uncoiling and calcification of the aorta. Vascular pattern is normal. Right lung is clear. Mild left lower lobe atelectasis similar to prior study.  IMPRESSION: No significant acute findings.   Electronically Signed   By: Esperanza Heiraymond  Rubner M.D.   On: 09/21/2014 01:17     EKG Interpretation None      MDM   Final diagnoses:  Hyperglycemia  Sepsis, due to unspecified organism  UTI (lower urinary tract infection)    Patient presents with altered mental status and high blood sugar readings at home. Currently on hospice. She is somnolent but arousable on exam. Not following commands and noncontributory to history taking. She is afebrile. Initial vital signs notable for mild tachycardia at 104.  Initial glucose greater than 600.  Patient given a normal saline bolus.  Workup notable for acute kidney injury as well as hypernatremia likely secondary to dehydration. Hyperglycemic to 766. Mild leukocytosis to 11.4. Urinalysis with too numerous to count white cells and many bacteria. Patient placed on glucose stabilizer and given a second liter of fluids. Sepsis workup including urine and blood cultures obtained. Patient given 2 g of Rocephin for UTI. Suspect acute UTI causing sepsis and hyperglycemia. Given that these are treatable, will admit for further management.    Shon Batonourtney F Auston Halfmann, MD 09/21/14 [phone]

## 2014-09-22 LAB — GLUCOSE, CAPILLARY
GLUCOSE-CAPILLARY: 112 mg/dL — AB (ref 70–99)
Glucose-Capillary: 249 mg/dL — ABNORMAL HIGH (ref 70–99)
Glucose-Capillary: 274 mg/dL — ABNORMAL HIGH (ref 70–99)
Glucose-Capillary: 240 mg/dL — ABNORMAL HIGH (ref 70–99)
Glucose-Capillary: 82 mg/dL (ref 70–99)

## 2014-09-22 LAB — URINE CULTURE

## 2014-09-22 LAB — BASIC METABOLIC PANEL
Anion gap: 13 (ref 5–15)
BUN: 42 mg/dL — ABNORMAL HIGH (ref 6–23)
CO2: 18 mEq/L — ABNORMAL LOW (ref 19–32)
Calcium: 8.8 mg/dL (ref 8.4–10.5)
Chloride: 122 mEq/L — ABNORMAL HIGH (ref 96–112)
Creatinine, Ser: 1.22 mg/dL — ABNORMAL HIGH (ref 0.50–1.10)
GFR calc non Af Amer: 36 mL/min — ABNORMAL LOW (ref >90)
GFR, EST AFRICAN AMERICAN: 42 mL/min — AB (ref >90)
GLUCOSE: 288 mg/dL — AB (ref 70–99)
POTASSIUM: 3.5 meq/L — AB (ref 3.7–5.3)
Sodium: 153 mEq/L — ABNORMAL HIGH (ref 137–147)

## 2014-09-22 MED ORDER — POTASSIUM CHLORIDE 10 MEQ/100ML IV SOLN
10.0000 meq | INTRAVENOUS | Status: AC
  Administered 2014-09-22 (×3): 10 meq via INTRAVENOUS
  Filled 2014-09-22 (×3): qty 100

## 2014-09-22 MED ORDER — FLUCONAZOLE IN SODIUM CHLORIDE 200-0.9 MG/100ML-% IV SOLN
200.0000 mg | Freq: Every day | INTRAVENOUS | Status: DC
  Administered 2014-09-22: 200 mg via INTRAVENOUS
  Filled 2014-09-22: qty 100

## 2014-09-22 MED ORDER — FLUCONAZOLE 100MG IVPB
100.0000 mg | Freq: Every day | INTRAVENOUS | Status: DC
  Administered 2014-09-23: 100 mg via INTRAVENOUS
  Filled 2014-09-22: qty 50

## 2014-09-22 NOTE — Progress Notes (Signed)
Patient ID: Anne [name] Worthing, female   DOB: 02-01-1916, 80 y.o.   MRN: [id number] [facility] PROGRESS NOTE  Anne [name] Vandeventer [id number] DOB: 02-01-1916 DOA: 09/20/2014 PCP: Martha ClanShaw, William, MD  Brief narrative: 80 y.o. female with advanced dementia currently in hospice care who [facility] with altered mental status. She was found to have UTI, hyperglycemia and hypernatremia.   Assessment/Plan:    Principal Problem: Acute encephalopathy - likely due to UTI, dehydration, hyperglycemia - pt required insulin drip on admission but once AG closed we stopped it, 09/21/2014. - no significant changes in mental status in past 24 hours  - continue IV fluids, 1/2 NS Active Problems: Hyperglycemia / diabetes mellitus controlled - required insulin drip on this admission; stopped once anion gap closed (09/21/2014 afternoon) - pt recently taken off of antihyperglycemics which may have contributed to hyperglycemia - CBG'[name] in past 24 hours: 148, 249, 274 - continue lantus 3 units BID and SSI. - A1c in 2014 was 6.7 indicating good glycemic control - no need to recheck A1c, pt follows with hospice  Acute renal failure / metabolic acidosis / normal anion gap - secondary to dehydration - renal function improving with IV fluids   UTI (lower urinary tract infection) - continue rocephin - urine culture so far with less than 100K colonies but with yeast and lactobacillus; follow up final result  - added fluconazole for yeast infection   Hypernatremia - secondary to dehydration  - we will continue current IV fluids   Hypokalemia - likely because of insulin - repleted today   Severe protein calorie malnutrition - in the context of chronic illness   Functional quadriplegia - essentially bed bound form advanced dementia    DVT Prophylaxis   SCD'[name] bilaterally  Code Status: DNR/DNI Family Communication: plan of care discussed with the patient'[name] family at the bedside   Disposition Plan: Home when stable.    IV access:   PeripheralIV  Procedures and diagnostic studies:    Dg Chest Portable 1 View 09/21/2014    No significant acute findings.    Medical Consultants:   Hospice  Other Consultants:   None  IAnti-Infectives:    Rocephin 09/21/2014 -->  Fluconazole 09/21/2014 -->   Manson PasseyEVINE, Asjah Rauda, MD  [facility] Pager [phone]  If 7PM-7AM, please contact night-coverage [email or web address] Password [email or web address] 09/22/2014, 8:07 AM   LOS: 2 days    HPI/Subjective: No acute overnight events.  Objective: Filed Vitals:   09/22/14 0532 09/22/14 0536 09/22/14 0547 09/22/14 0550  BP:      Pulse: 68     Temp:      TempSrc:      Resp: 23  21   Height:  4' 11.84" (1.52 m)  4' 11.84" (1.52 m)  Weight:      SpO2: 97%       Intake/Output Summary (Last 24 hours) at 09/22/14 [id number] Last data filed at 09/22/14 [id number]  Gross per 24 hour  Intake   1407 ml  Output      0 ml  Net   1407 ml    Exam:   General:  Pt is not in acute distress  Cardiovascular: Regular rate and rhythm, S1/S2 appreciated   Respiratory: bilateral air, entry, no wheezing   Abdomen: non distended, bowel sounds present  Extremities: No edema, pulses DP and PT palpable bilaterally  Neuro: Grossly nonfocal  Data Reviewed: Basic Metabolic Panel:  Recent Labs Lab 09/20/14 2259 09/21/14 0600 09/21/14 1141  NA 150* 154*  155*  K 4.6 4.0 4.4  CL 113* 121* 123*  CO2 20 17* 19  GLUCOSE 766* 238* 186*  BUN 64* 58* 54*  CREATININE 1.79* 1.51* 1.42*  CALCIUM 9.6 9.4 9.2   Liver Function Tests:  Recent Labs Lab 09/20/14 2259  AST 13  ALT 11  ALKPHOS 139*  BILITOT <0.2*  PROT 7.5  ALBUMIN 3.1*   No results for input([name]): LIPASE, AMYLASE in the last 168 hours. No results for input([name]): AMMONIA in the last 168 hours. CBC:  Recent Labs Lab 09/20/14 2259 09/21/14 0737  WBC 11.4* 13.6*  NEUTROABS 9.8*  --   HGB 10.9* 10.2*  HCT 36.0 34.1*  MCV 78.8 78.9   PLT 302 252   Cardiac Enzymes: No results for input([name]): CKTOTAL, CKMB, CKMBINDEX, TROPONINI in the last 168 hours. BNP: Invalid input([name]): POCBNP CBG:  Recent Labs Lab 09/21/14 1428 09/21/14 1657 09/21/14 2135 09/22/14 0226 09/22/14 0756  GLUCAP 237* 154* 148* 249* 274*    Recent Results (from the past 240 hour([name]))  Urine culture     Status: None   Collection Time: 09/20/14 11:49 PM  Result Value Ref Range Status   Specimen Description URINE, CATHETERIZED  Final   Special Requests NONE  Final   Culture  Setup Time   Final    09/21/2014 04:21 Performed at [facility]    Colony Count   Final    50,000 COLONIES/ML Performed at [facility]    Culture   Final    YEAST LACTOBACILLUS SPECIES Note: Standardized susceptibility testing for this organism is not available. Performed at [facility]    Report Status 09/22/2014 FINAL  Final     Scheduled Meds: . Rocephin   1 g Intravenous Q0600  . divalproex  125 mg Oral QHS  . fluconazole   200 mg Intravenous Q0600  . insulin aspart  0-5 Units Subcutaneous QHS  . insulin aspart  0-9 Units Subcutaneous TID WC  . insulin glargine  3 Units Subcutaneous BID  . pantoprazole  40 mg Oral Daily  . polyethylene glycol  17 g Oral Daily  . QUEtiapine  25 mg Oral BID   Continuous Infusions: . sodium chloride 1,000 mL (09/22/14 0345)  . insulin (NOVOLIN-R) infusion Stopped (09/21/14 1436)

## 2014-09-22 NOTE — Progress Notes (Signed)
ANTIBIOTIC CONSULT NOTE - Follow up  Pharmacy Consult for fluconazole Indication: UTI  No Known Allergies  Patient Measurements: Height: 4' 11.84" (152 cm) Weight: 106 lb 4.2 oz (48.2 kg) IBW/kg (Calculated) : 45.14 Adjusted Body Weight:   Vital Signs: Temp: 98.8 F (37.1 C) (11/15 0434) Temp Source: Axillary (11/15 0434) BP: 137/69 mmHg (11/15 0434) Pulse Rate: 68 (11/15 0532) Intake/Output from previous day: 11/14 0701 - 11/15 0700 In: 1407 [P.O.:40; I.V.:1167; IV Piggyback:200] Out: -  Intake/Output from this shift:    Labs:  Recent Labs  09/20/14 2259 09/21/14 0600 09/21/14 0737 09/21/14 1141 09/22/14 0842  WBC 11.4*  --  13.6*  --   --   HGB 10.9*  --  10.2*  --   --   PLT 302  --  252  --   --   CREATININE 1.79* 1.51*  --  1.42* 1.22*   Estimated Creatinine Clearance: 19.2 mL/min (by C-G formula based on Cr of 1.22). No results for input(s): VANCOTROUGH, VANCOPEAK, VANCORANDOM, GENTTROUGH, GENTPEAK, GENTRANDOM, TOBRATROUGH, TOBRAPEAK, TOBRARND, AMIKACINPEAK, AMIKACINTROU, AMIKACIN in the last 72 hours.   Microbiology: Recent Results (from the past 720 hour(s))  Urine culture     Status: None   Collection Time: 09/20/14 11:49 PM  Result Value Ref Range Status   Specimen Description URINE, CATHETERIZED  Final   Special Requests NONE  Final   Culture  Setup Time   Final    09/21/2014 04:21 Performed at [facility]    Colony Count   Final    50,000 COLONIES/ML Performed at [facility]    Culture   Final    YEAST LACTOBACILLUS SPECIES Note: Standardized susceptibility testing for this organism is not available. Performed at [facility]    Report Status 09/22/2014 FINAL  Final   Assessment: 80yo F on hospice care at home admitted w/ AMS, hyperglycemia, hypernatremia, and UTI. Rocephin started. Urine culture growing yeast. Pharmacy is asked to dose fluconazole.   11/15 >> Rocephin >> 11/15 >> Fluconazole >>  Tmax:  AF WBCs: elev Renal: SCr improved to 1.22, 19CG  11/13 urine: yeast, lactobacillus  11/14 blood x 2: sent  Goal of Therapy:  Fluconazole dosed based on patient weight and renal function  Plan:   After the initial 200mg  dose, change Fluconazole to 100mg  IV q24h.  Follow up renal fxn and culture results.  Charolotte Ekeom Rigoberto Repass, PharmD, pager [phone]. 09/22/2014,12:17 PM.

## 2014-09-22 NOTE — Progress Notes (Signed)
ANTIBIOTIC CONSULT NOTE - INITIAL  Pharmacy Consult for fluconazole Indication: UTI  No Known Allergies  Patient Measurements: Height: 4' 11.84" (152 cm) Weight: 106 lb 4.2 oz (48.2 kg) IBW/kg (Calculated) : 45.14 Adjusted Body Weight:   Vital Signs: Temp: 98.8 F (37.1 C) (11/15 0434) Temp Source: Axillary (11/15 0434) BP: 137/69 mmHg (11/15 0434) Pulse Rate: 56 (11/15 0434) Intake/Output from previous day: 11/14 0701 - 11/15 0700 In: 1297 [P.O.:30; I.V.:1167; IV Piggyback:100] Out: -  Intake/Output from this shift: Total I/O In: 1297 [P.O.:30; I.V.:1167; IV Piggyback:100] Out: -   Labs:  Recent Labs  09/20/14 2259 09/21/14 0600 09/21/14 0737 09/21/14 1141  WBC 11.4*  --  13.6*  --   HGB 10.9*  --  10.2*  --   PLT 302  --  252  --   CREATININE 1.79* 1.51*  --  1.42*   Estimated Creatinine Clearance: 16.5 mL/min (by C-G formula based on Cr of 1.42). No results for input(s): VANCOTROUGH, VANCOPEAK, VANCORANDOM, GENTTROUGH, GENTPEAK, GENTRANDOM, TOBRATROUGH, TOBRAPEAK, TOBRARND, AMIKACINPEAK, AMIKACINTROU, AMIKACIN in the last 72 hours.   Microbiology: Recent Results (from the past 720 hour(s))  Urine culture     Status: None   Collection Time: 09/20/14 11:49 PM  Result Value Ref Range Status   Specimen Description URINE, CATHETERIZED  Final   Special Requests NONE  Final   Culture  Setup Time   Final    09/21/2014 04:21 Performed at [facility]    Colony Count   Final    50,000 COLONIES/ML Performed at [facility]    Culture   Final    YEAST LACTOBACILLUS SPECIES Note: Standardized susceptibility testing for this organism is not available. Performed at [facility]    Report Status 09/22/2014 FINAL  Final    Medical History: Past Medical History  Diagnosis Date  . Diabetes mellitus   . Dementia   . Thyroid disease   . Hypertension   . Murmur, heart 10/13/2013    Medications:  Anti-infectives    Start     Dose/Rate  Route Frequency Ordered Stop   09/22/14 0600  fluconazole (DIFLUCAN) IVPB 200 mg     200 mg100 mL/hr over 60 Minutes Intravenous Daily 09/22/14 0541     09/22/14 0115  cefTRIAXone (ROCEPHIN) 1 g in dextrose 5 % 50 mL IVPB  Status:  Discontinued     1 g100 mL/hr over 30 Minutes Intravenous Every 24 hours 09/21/14 0257 09/21/14 0459   09/21/14 0515  cefTRIAXone (ROCEPHIN) 1 g in dextrose 5 % 50 mL IVPB - Premix     1 g100 mL/hr over 30 Minutes Intravenous Daily 09/21/14 0500     09/21/14 0115  cefTRIAXone (ROCEPHIN) 2 g in dextrose 5 % 50 mL IVPB  Status:  Discontinued     2 g100 mL/hr over 30 Minutes Intravenous  Once 09/21/14 0114 09/21/14 0459     Assessment: Patient with UTI.  MD wants fluconazole per pharmacy.  Renal function <30 at time.  Goal of Therapy:  Fluconazole dosed based on patient weight and renal function   Plan:  Follow up culture results  Fluconazole 200mg  iv q24hr  Anne [name], Anne ShoneJulian Crowford 09/22/2014,5:46 AM

## 2014-09-22 NOTE — Progress Notes (Signed)
Spoke to Elray McgregorMary Lynch NP about urine cx + with yeast lactobacillus species. Orders received.

## 2014-09-22 NOTE — Plan of Care (Signed)
Problem: Phase I Progression Outcomes Goal: Voiding-avoid urinary catheter unless indicated Outcome: Completed/Met Date Met:  09/22/14

## 2014-09-22 NOTE — Progress Notes (Signed)
Checked CBG at 0230 to monitor levels. CBG was 249. Spoke with NP Elray McgregorMary Lynch and no new orders received. Check again prior to breakfast.

## 2014-09-22 NOTE — Progress Notes (Signed)
GIP visit [facility]2 [facility] Os[facility] RN  Related admission to hospice diagnosis of Alzheimers. Patient is a DNR. Patient is no longer on a insulin drip patient has a bag of potassium hanging. No family at bedside. Patient responds to verbal stimuli but does not open her eyes.  Patient has no signs of symptoms of pain/agitation/SOB at this time.   Please call with any hospice concerns [phone] Ulis RiasErin Osborne RN [facility]

## 2014-09-22 NOTE — Plan of Care (Signed)
Problem: Phase I Progression Outcomes Goal: Pain controlled with appropriate interventions Outcome: Completed/Met Date Met:  09/22/14     

## 2014-09-23 LAB — GLUCOSE, CAPILLARY
GLUCOSE-CAPILLARY: 122 mg/dL — AB (ref 70–99)
GLUCOSE-CAPILLARY: 162 mg/dL — AB (ref 70–99)

## 2014-09-23 LAB — BASIC METABOLIC PANEL
ANION GAP: 11 (ref 5–15)
BUN: 30 mg/dL — AB (ref 6–23)
CO2: 20 mEq/L (ref 19–32)
Calcium: 8.8 mg/dL (ref 8.4–10.5)
Chloride: 112 mEq/L (ref 96–112)
Creatinine, Ser: 1.08 mg/dL (ref 0.50–1.10)
GFR, EST AFRICAN AMERICAN: 49 mL/min — AB (ref >90)
GFR, EST NON AFRICAN AMERICAN: 42 mL/min — AB (ref >90)
Glucose, Bld: 148 mg/dL — ABNORMAL HIGH (ref 70–99)
Potassium: 3.6 mEq/L — ABNORMAL LOW (ref 3.7–5.3)
Sodium: 143 mEq/L (ref 137–147)

## 2014-09-23 MED ORDER — INSULIN ASPART 100 UNIT/ML ~~LOC~~ SOLN: 0.0000 [IU] | Freq: Every day | SUBCUTANEOUS | Status: DC

## 2014-09-23 MED ORDER — INSULIN GLARGINE 100 UNIT/ML ~~LOC~~ SOLN: 3.0000 [IU] | Freq: Two times a day (BID) | SUBCUTANEOUS | Status: DC

## 2014-09-23 MED ORDER — FLUCONAZOLE 10 MG/ML PO SUSR: 100.0000 mg | Freq: Every day | ORAL | Status: DC

## 2014-09-23 MED ORDER — POTASSIUM CHLORIDE CRYS ER 20 MEQ PO TBCR
40.0000 meq | EXTENDED_RELEASE_TABLET | Freq: Once | ORAL | Status: AC
  Administered 2014-09-23: 40 meq via ORAL
  Filled 2014-09-23: qty 2

## 2014-09-23 NOTE — Progress Notes (Signed)
Pt for discharge home with Hospice and [facility]. CSW received notification that pt needing ambulance transport home.   CSW met with pt spouse at bedside to confirm address.  CSW discussed with RN and arranged ambulance transport for pt to home via [facility].  Pt spouse aware and agreeable.  No further social work needs identified at this time.  CSW signing off.   Alison Murray, MSW, [place] Work [phone]

## 2014-09-23 NOTE — Discharge Summary (Signed)
Physician Discharge Summary  Anne [name] [id number] DOB: August 02, 1916 DOA: 09/20/2014  PCP: Martha ClanShaw, William, MD  Admit date: 09/20/2014 Discharge date: 09/23/2014  Recommendations for Outpatient Follow-up:  1. Take fluconazole for 4 more days on discharge for yeast urinary tract infection   2. Please note changes in insulin regimen: lantus 3 units twice a day and sensitive sliding scale. Sensitive sliding scale to be used as follows: CBG 201 - 250: 2 units   CBG 251 - 300: 3 units   CBG 301 - 350: 4 units   CBG 351 - 400: 5 units    Discharge Diagnoses:  Active Problems:   Diabetes mellitus   Acute renal failure   Hyperglycemia due to type 2 diabetes mellitus   UTI (lower urinary tract infection)   Altered mental status   Hypernatremia   Acute encephalopathy    Discharge Condition: stable   Diet recommendation: as tolerated   History of present illness:  80 y.o. female with advanced dementia currently in hospice care who [facility] with altered mental status. She was found to have UTI, hyperglycemia and hypernatremia.   Assessment/Plan:    Principal Problem: Acute encephalopathy - likely due to UTI, dehydration, hyperglycemia - pt required insulin drip on admission but once AG closed we stopped it, 09/21/2014. - patient is more alert this morning. - I spoke with the family at the bedside, in agreement patient is stable for discharge today  Active Problems: Hyperglycemia / diabetes mellitus controlled - required insulin drip on this admission; stopped once anion gap closed (09/21/2014 afternoon) - pt recently taken off of antihyperglycemics which may have contributed to hyperglycemia - we will continue Lantus 3 units twice daily on discharge along with sensitive sliding scale insulin. Instruction provided on how to use sliding scale insulin. - A1c in 2014 was 6.7 indicating good glycemic control - no need to recheck A1c, pt follows  with hospice  Acute renal failure / metabolic acidosis / normal anion gap - secondary to dehydration - renal function is within normal limits prior to discharge  UTI (lower urinary tract infection) - patient was started on Rocephin on admission. Final urine culture with less than 100,000 colonies, yeast and lactobacillus.  - We will continue fluconazole on discharge for yeast. Because there are less than 100,000 colonies on urine culture patient does not require other antibiotic on discharge because she completed 4 days of IV Rocephin.  Hypernatremia - secondary to dehydration  - sodium is within normal limits prior to discharge  Hypokalemia - likely because of insulin - potassium is supplemented prior to discharge.  Severe protein calorie malnutrition - in the context of chronic illness  Functional quadriplegia - essentially bed bound form advanced dementia  - follows with hospice   DVT Prophylaxis   SCD's bilaterally  Code Status: DNR/DNI Family Communication: plan of care discussed with the patient's family at the bedside    IV access:   PeripheralIV  Procedures and diagnostic studies:   Dg Chest Portable 1 View 09/21/2014 No significant acute findings.   Medical Consultants:   Hospice  Other Consultants:   None  IAnti-Infectives:    Rocephin 09/21/2014 --> 09/23/2014   Fluconazole 09/21/2014 --> for 4 more days on discharge    Signed:  Manson PasseyEVINE, ALMA, MD  [facility] 09/23/2014, 10:46 AM  Pager #: [phone]    Discharge Exam: Filed Vitals:   09/23/14 0534  BP: 142/76  Pulse:   Temp: 97.7 F (36.5 C)  Resp: 20   Filed Vitals:   09/22/14 1327 09/22/14 1955 09/22/14 2045 09/23/14 0534  BP: 124/68  159/74 142/76  Pulse: 67 80 72   Temp: 98.1 F (36.7 C)  97.6 F (36.4 C) 97.7 F (36.5 C)  TempSrc: Axillary  Axillary Oral  Resp: 20  20 20   Height:      Weight:      SpO2: 98%  96%     General: Pt is  more alert this am, no distress Cardiovascular: Regular rate and rhythm, S1/S2 appreciated  Respiratory: No wheezing, no crackles, no rhonchi Abdominal: non distended, bowel sounds +, no guarding Extremities: no cyanosis, pulses palpable bilaterally DP and PT Neuro: Grossly nonfocal  Discharge Instructions  Discharge Instructions    Call MD for:  difficulty breathing, headache or visual disturbances    Complete by:  As directed      Call MD for:  persistant dizziness or light-headedness    Complete by:  As directed      Call MD for:  persistant nausea and vomiting    Complete by:  As directed      Call MD for:  severe uncontrolled pain    Complete by:  As directed      Diet - low sodium heart healthy    Complete by:  As directed      Discharge instructions    Complete by:  As directed   1. Take fluconazole for 4 more days on discharge for yeast urinary tract infection  2. Please note changes in insulin regimen: lantus 3 units twice a day and sensitive sliding scale. Sensitive sliding scale to be used as follows: CBG 201 - 250: 2 units   CBG 251 - 300: 3 units   CBG 301 - 350: 4 units   CBG 351 - 400: 5 units     Increase activity slowly    Complete by:  As directed             Medication List    STOP taking these medications        ciprofloxacin 500 MG tablet  Commonly known as:  CIPRO     insulin regular 100 units/mL injection  Commonly known as:  NOVOLIN R,HUMULIN R     LORazepam 0.5 MG tablet  Commonly known as:  ATIVAN      TAKE these medications        acetaminophen 325 MG tablet  Commonly known as:  TYLENOL  Take 650 mg by mouth every 6 (six) hours as needed for mild pain.     bismuth subsalicylate 262 MG/15ML suspension  Commonly known as:  PEPTO BISMOL  Take 30 mLs by mouth every 4 (four) hours as needed for indigestion or diarrhea or loose stools.     divalproex 125 MG capsule  Commonly known as:  DEPAKOTE SPRINKLE  Take 125 mg by mouth at  bedtime.     fluconazole 10 MG/ML suspension  Commonly known as:  DIFLUCAN  Take 10 mLs (100 mg total) by mouth daily.     insulin aspart 100 UNIT/ML injection  Commonly known as:  novoLOG  Inject 0-5 Units into the skin at bedtime.     insulin glargine 100 UNIT/ML injection  Commonly known as:  LANTUS  Inject 0.03 mLs (3 Units total) into the skin 2 (two) times daily.     magnesium hydroxide 400 MG/5ML suspension  Commonly known as:  MILK OF MAGNESIA  Take 5 mLs by mouth daily  as needed for mild constipation.     omeprazole 20 MG capsule  Commonly known as:  PRILOSEC  Take 20 mg by mouth daily.     OVER THE COUNTER MEDICATION  Take 1 Can by mouth daily. Boost     polyethylene glycol packet  Commonly known as:  MIRALAX / GLYCOLAX  Take 17 g by mouth daily.     QUEtiapine 50 MG tablet  Commonly known as:  SEROQUEL  Take 25 mg by mouth 2 (two) times daily.     traMADol 50 MG tablet  Commonly known as:  ULTRAM  Take 50 mg by mouth every 6 (six) hours as needed for moderate pain.           Follow-up Information    Follow up with Martha Clan, MD. Schedule an appointment as soon as possible for a visit in 2 weeks.   Specialty:  Internal Medicine   Why:  As needed, Follow up appt after recent hospitalization   Contact information:   [address] [place] [address] [phone]        The results of significant diagnostics from this hospitalization (including imaging, microbiology, ancillary and laboratory) are listed below for reference.    Significant Diagnostic Studies: Dg Chest Portable 1 View  09/21/2014   CLINICAL DATA:  Initial evaluation Pt presented from home via EMS, pt on hospice care, bed ridden, report of recently being taken off glipizide by her care team for DM type 2. Family report change in mental status from her dementia baseline. EMS glucometer reports High. Pt lethargic at this time.  EXAM: PORTABLE CHEST - 1 VIEW  COMPARISON:  01/26/1949   FINDINGS: Stable mild cardiac enlargement. Stable uncoiling and calcification of the aorta. Vascular pattern is normal. Right lung is clear. Mild left lower lobe atelectasis similar to prior study.  IMPRESSION: No significant acute findings.   Electronically Signed   By: Esperanza Heir M.D.   On: 09/21/2014 01:17    Microbiology: Recent Results (from the past 240 hour(s))  Urine culture     Status: None   Collection Time: 09/20/14 11:49 PM  Result Value Ref Range Status   Specimen Description URINE, CATHETERIZED  Final   Special Requests NONE  Final   Culture  Setup Time   Final    09/21/2014 04:21 Performed at [facility] Count   Final    50,000 COLONIES/ML Performed at [facility]    Culture   Final    YEAST LACTOBACILLUS SPECIES Note: Standardized susceptibility testing for this organism is not available. Performed at [facility]    Report Status 09/22/2014 FINAL  Final     Labs: Basic Metabolic Panel:  Recent Labs Lab 09/20/14 2259 09/21/14 0600 09/21/14 1141 09/22/14 0842 09/23/14 0420  NA 150* 154* 155* 153* 143  K 4.6 4.0 4.4 3.5* 3.6*  CL 113* 121* 123* 122* 112  CO2 20 17* 19 18* 20  GLUCOSE 766* 238* 186* 288* 148*  BUN 64* 58* 54* 42* 30*  CREATININE 1.79* 1.51* 1.42* 1.22* 1.08  CALCIUM 9.6 9.4 9.2 8.8 8.8   Liver Function Tests:  Recent Labs Lab 09/20/14 2259  AST 13  ALT 11  ALKPHOS 139*  BILITOT <0.2*  PROT 7.5  ALBUMIN 3.1*   No results for input(s): LIPASE, AMYLASE in the last 168 hours. No results for input(s): AMMONIA in the last 168 hours. CBC:  Recent Labs Lab 09/20/14 2259 09/21/14 0737  WBC 11.4* 13.6*  NEUTROABS 9.8*  --   HGB 10.9* 10.2*  HCT 36.0 34.1*  MCV 78.8 78.9  PLT 302 252   Cardiac Enzymes: No results for input(s): CKTOTAL, CKMB, CKMBINDEX, TROPONINI in the last 168 hours. BNP: BNP (last 3 results) No results for input(s): PROBNP in the last 8760 hours. CBG:  Recent  Labs Lab 09/22/14 1312 09/22/14 1651 09/22/14 2048 09/23/14 0232 09/23/14 0739  GLUCAP 240* 112* 82 122* 162*    Time coordinating discharge: Over 30 minutes

## 2014-09-23 NOTE — Progress Notes (Signed)
Patient was stable at time of discharge. IVs were removed. I reviewed discharge education with patient's husband. He verbalized understanding and had no further questions.

## 2014-09-23 NOTE — Progress Notes (Signed)
Camila LiOsman called back and ordered to give the 3 units of lantus now.

## 2014-09-23 NOTE — Progress Notes (Signed)
Inpatient [facility] Rm [facility]2 [facility]  [facility] Alzheimers Disease pt is a DNR code Status Pt and husband seen at bedside; pt awake answering questions with one /two word answers;  No s/sx discomfort at time of visit; husband voiced she is more alert this morning and they are looking forward to her coming home; Husband is going home to open the house and make sure their hired care giver is at the home. Discharge [facility] and [facility] team will follow after discharge. Please contact [facility] at [phone] with any hospice needs Valente DavidMargie Benoit Meech, RN MSN [facility] Hospital Liaison [phone]

## 2014-09-23 NOTE — Progress Notes (Signed)
Spoke to on call S. Camila LiOsman about CBG 82. Camila LiOsman told to this RN to hold lantus tonight and recheck around 2am and recall her with level.

## 2014-09-23 NOTE — Care Management Note (Signed)
CARE MANAGEMENT NOTE 09/23/2014  Patient:  Anne [name],Anne [name]   Account Number:  [id number]  Date Initiated:  09/23/2014  Documentation initiated by:  Sandford CrazeLEMENTS,Ringo Sherod  Subjective/Objective Assessment:   80 yo admitted with DM.     Action/Plan:   From home with [facility]   Anticipated DC Date:  09/23/2014   Anticipated [facility]  [facility]         Choice offered to / List presented [facility]:             [facility] [place]   Status of service:  Completed, signed off Medicare Important Message given?   (If response is "NO", the following Medicare IM given date fields will be blank) Date Medicare IM given:   Medicare IM given by:   Date Additional Medicare IM given:   Additional Medicare IM given by:    Discharge Disposition:    Per UR Regulation:  Reviewed for med. necessity/level of care/duration of stay  If discussed at Long Length of Stay Meetings, dates discussed:    Comments:  09/23/14 Sandford Crazeora Revere Maahs RN,BSN,NCM Pt DC'd home with [facility] this am.  No CM needs were reported.

## 2014-09-23 NOTE — Progress Notes (Signed)
Paged Anne [name] about cbg being 122 at recheck.

## 2014-09-23 NOTE — Discharge Instructions (Signed)

## 2014-09-25 ENCOUNTER — Ambulatory Visit: Payer: Medicare Other | Admitting: Internal Medicine

## 2014-09-25 ENCOUNTER — Encounter: Payer: Self-pay | Admitting: Internal Medicine

## 2014-09-25 VITALS — BP 92/60 | HR 90 | Resp 20

## 2014-09-25 DIAGNOSIS — E119 Type 2 diabetes mellitus without complications: Secondary | ICD-10-CM

## 2014-09-25 DIAGNOSIS — F0280 Dementia in other diseases classified elsewhere without behavioral disturbance: Secondary | ICD-10-CM

## 2014-09-25 DIAGNOSIS — G309 Alzheimer's disease, unspecified: Secondary | ICD-10-CM

## 2014-09-25 DIAGNOSIS — R451 Restlessness and agitation: Secondary | ICD-10-CM | POA: Insufficient documentation

## 2014-09-25 DIAGNOSIS — E43 Unspecified severe protein-calorie malnutrition: Secondary | ICD-10-CM

## 2014-09-25 DIAGNOSIS — I639 Cerebral infarction, unspecified: Secondary | ICD-10-CM

## 2014-09-25 DIAGNOSIS — N390 Urinary tract infection, site not specified: Secondary | ICD-10-CM

## 2014-09-25 NOTE — Progress Notes (Signed)
Subjective:    Patient ID: Anne [name], female    DOB: October 02, 1916, 80 y.o.   MRN: [id number]  HPI  Husband Annette StableBill here Caregiver Becky--she splits 1 week on, 1 week off with another caregiver Hospice nurse RN  Started with dementia for 6-7 years Husband retired at [address] to care for her Larey SeatFell and broke hip in 2014---he hired help after that  Had been able to get out of bed Weight bear to transfer till hospitalization last week Would feed herself some finger foods and use utensils at times Would actually ask to go to bathroom  Now mostly in bed Won't bear weight Incontinent Not helping with eating  Has had recurrent UTIs There has been trouble with this Just in the hospital last week Since then, she has more trouble swallowing, holding food and drink in mouth, face seems drooped  Diabetes for some years Had been just on glipizide and then this was stopped via hospice After hospital, put on sliding scale but this was stopped   Current Outpatient Prescriptions on File Prior to Visit  Medication Sig Dispense Refill  . acetaminophen (TYLENOL) 325 MG tablet Take 650 mg by mouth every 6 (six) hours as needed for mild pain.    Marland Kitchen. bismuth subsalicylate (PEPTO BISMOL) 262 MG/15ML suspension Take 30 mLs by mouth every 4 (four) hours as needed for indigestion or diarrhea or loose stools. 360 mL 0  . fluconazole (DIFLUCAN) 10 MG/ML suspension Take 10 mLs (100 mg total) by mouth daily. 35 mL 0  . insulin aspart (NOVOLOG) 100 UNIT/ML injection Inject 0-5 Units into the skin at bedtime. 10 mL 1  . insulin glargine (LANTUS) 100 UNIT/ML injection Inject 0.03 mLs (3 Units total) into the skin 2 (two) times daily. (Patient taking differently: Inject 6 Units into the skin daily. ) 10 mL 1  . magnesium hydroxide (MILK OF MAGNESIA) 400 MG/5ML suspension Take 5 mLs by mouth daily as needed for mild constipation.    Marland Kitchen. omeprazole (PRILOSEC) 20 MG capsule Take 20 mg by mouth daily.    Marland Kitchen. OVER THE COUNTER  MEDICATION Take 1 Can by mouth daily. Boost    . polyethylene glycol (MIRALAX / GLYCOLAX) packet Take 17 g by mouth daily.    . QUEtiapine (SEROQUEL) 50 MG tablet Take 25 mg by mouth 2 (two) times daily.    . traMADol (ULTRAM) 50 MG tablet Take 50 mg by mouth every 6 (six) hours as needed for moderate pain.     No current facility-administered medications on file prior to visit.    No Known Allergies  Past Medical History  Diagnosis Date  . Diabetes mellitus   . Alzheimer's dementia   . Thyroid disease   . Hypertension   . Murmur, heart 10/13/2013    Past Surgical History  Procedure Laterality Date  . Abdominal hysterectomy  1946    partial  . Hip arthroplasty Left 10/14/2013    Procedure: ARTHROPLASTY BIPOLAR HIP;  Surgeon: Nadara MustardMarcus V Duda, MD;  Location: [facility];  Service: Orthopedics;  Laterality: Left;    Family History  Problem Relation Age of Onset  . Stroke Mother   . Other Father     accident    History   Social History  . Marital Status: Married    Spouse Name: N/A    Number of Children: N/A  . Years of Education: N/A   Occupational History  . 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 Smoking status: Never Smoker   . Smokeless tobacco: Never Used  . Alcohol Use: No  . Drug Use: No  . Sexual Activity: No   Other Topics Concern  . Not on file   Social History Narrative   Has living will   Husband is health care POA--- son after   Has DNR order   No tube feeds    Review of Systems  Constitutional:       May be losing weight over time--but not much if any  HENT: Positive for trouble swallowing. Negative for hearing loss.        Bottom implanted teeth Doesn't wear dentures No mouth sores Occasional choking--- they have been thickening her liquids  Eyes: Negative for visual disturbance.  Respiratory: Negative for cough and shortness of breath.   Cardiovascular: Negative for chest pain,  palpitations and leg swelling.       Rare feet swelling if kept down  Gastrointestinal: Positive for abdominal pain and constipation.       Will complain of "my stomach is burning" On meds and extra mylanta at times Off miralax since on the antibiotics  Endocrine: Negative for polydipsia and polyuria.  Genitourinary: Positive for dysuria. Negative for difficulty urinating.  Musculoskeletal: Positive for back pain and arthralgias.       Some hip or back pain---hard to tell  Skin: Negative for rash.       No pressure ulcers Some redness on bottom from diarrhea  Neurological: Positive for headaches. Negative for syncope and light-headedness.       Daily headaches--tylenol, then tramadol do help  Psychiatric/Behavioral: Positive for hallucinations and behavioral problems. Negative for dysphoric mood.       Was agitated every evening--tried quetiapine and caused sedation Now better with 12.5mg  at 11AM, 25mg  at 4PM, 12.5mg  again at 8 TV seemed to reduce her agitation--they used to watch TV and she may remember this routine  Does call for her mama and thinks deceased relatives are around       Objective:   Physical Exam  Constitutional:  A;lert in bed Mumbling--with a few understandable words--mostly "Bill"  Neck: No thyromegaly present.  Cardiovascular: Normal rate, regular rhythm and intact distal pulses.  Exam reveals no gallop.   No murmur heard. Prominent S2  Pulmonary/Chest: Effort normal and breath sounds normal. No respiratory distress. She has no wheezes. She has no rales.  Abdominal: Soft. There is no tenderness.  Musculoskeletal: She exhibits no edema.  Lymphadenopathy:    She has no cervical adenopathy.  Neurological:  Weakness in right extremities and decreased tone in right leg Slight left facial droop Dysarthria is new  Psychiatric:  No overt depression          Assessment & Plan:

## 2014-09-25 NOTE — Assessment & Plan Note (Signed)
This is somewhat in question Will finish out the cipro and fluconazole for now Consider trimethoprim daily if really has frequent recurrences

## 2014-09-25 NOTE — Assessment & Plan Note (Signed)
Will start lantus at 6 units daily Have the regular for emergencies if sugar over 400 Check daily and adjust lantus if stays over 200

## 2014-09-25 NOTE — Assessment & Plan Note (Signed)
Obviously very different now No major apparent psychosis Will stop the quetiapine Add lorazepam 0.25-0.5mg  every 4 hours prn

## 2014-09-25 NOTE — Assessment & Plan Note (Signed)
Seems to have had an acute event---either as the cause of the change in status prompting the hospitalization--or during the hospital stay (I favor it being the cause of the problems) Will start ASA Marked functional change Already on hospice

## 2014-09-25 NOTE — Assessment & Plan Note (Signed)
Will try to continue the supplements Will reassess swallowing over time--hopefully will not need thickening (or at most nectar)

## 2014-09-25 NOTE — Assessment & Plan Note (Signed)
Had been fairly severe but not marked change in functional status ---likely from apparent stroke Total care Now incontinent

## 2014-09-27 LAB — CULTURE, BLOOD (ROUTINE X 2): Culture: NO GROWTH

## 2014-09-29 LAB — CULTURE, BLOOD (ROUTINE X 2): Culture: NO GROWTH

## 2014-10-08 ENCOUNTER — Telehealth: Payer: Self-pay | Admitting: Internal Medicine

## 2014-10-08 NOTE — Telephone Encounter (Signed)
[facility] A[facility] noticed increase agitation, yelling after lorazepam  Got temazepam for sleep off standing orders. 1 didn't help, 2 worked but then very somnolent the next day. Will keep to only one prn  Try alprazolam 0.25--- 1/2-1 tab tid prn  Sugars 200-300 fasting Hasn't needed regular Will increase the lantus to 10

## 2014-10-19 ENCOUNTER — Other Ambulatory Visit: Payer: Self-pay | Admitting: Internal Medicine

## 2014-10-21 NOTE — Telephone Encounter (Signed)
Approved: okay for a year 

## 2014-10-21 NOTE — Telephone Encounter (Signed)
rx sent to pharmacy by e-script  

## 2014-10-21 NOTE — Telephone Encounter (Signed)
Ok to fill 

## 2014-10-25 ENCOUNTER — Other Ambulatory Visit: Payer: Self-pay | Admitting: Internal Medicine

## 2014-10-30 ENCOUNTER — Telehealth: Payer: Self-pay | Admitting: Internal Medicine

## 2014-10-30 NOTE — Telephone Encounter (Signed)
Contacted yesterday by Maury Dusonna RN Still working on controlling her agitation Trying alprazolam 0.25 tid standing dose and will see if that works better than the prn dose

## 2014-11-11 ENCOUNTER — Telehealth: Payer: Self-pay | Admitting: Internal Medicine

## 2014-11-11 NOTE — Telephone Encounter (Signed)
Message from [facility] RN More lethargic 2 days ago Expiratory wheezes but no labored breathing Some cough Increased right side weakness Cipro 250 bid x 5 days prescribed by hospice MD Nebulizer at home--approved albuterol via neb 1 4 hours prn  Will monitor If worsens, I will try to get out there on Friday afternoon

## 2014-11-18 ENCOUNTER — Telehealth: Payer: Self-pay | Admitting: Internal Medicine

## 2014-11-18 NOTE — Telephone Encounter (Signed)
Phone call from [facility] hosp[facility] She is in process of recertification and he wanted to know my impressions of her status  At times she has PPS of 20%, especially with new event last week (probably CVA) I believe she still fits 6 month criteria for continuing hospice

## 2014-11-20 ENCOUNTER — Encounter: Payer: Self-pay | Admitting: Internal Medicine

## 2014-11-20 ENCOUNTER — Ambulatory Visit: Admitting: Internal Medicine

## 2014-11-20 VITALS — BP 118/60 | HR 72 | Resp 18

## 2014-11-20 DIAGNOSIS — I639 Cerebral infarction, unspecified: Secondary | ICD-10-CM | POA: Diagnosis not present

## 2014-11-20 DIAGNOSIS — E43 Unspecified severe protein-calorie malnutrition: Secondary | ICD-10-CM | POA: Diagnosis not present

## 2014-11-20 DIAGNOSIS — E119 Type 2 diabetes mellitus without complications: Secondary | ICD-10-CM

## 2014-11-20 DIAGNOSIS — R451 Restlessness and agitation: Secondary | ICD-10-CM

## 2014-11-20 DIAGNOSIS — G309 Alzheimer's disease, unspecified: Secondary | ICD-10-CM

## 2014-11-20 DIAGNOSIS — I350 Nonrheumatic aortic (valve) stenosis: Secondary | ICD-10-CM

## 2014-11-20 DIAGNOSIS — F0280 Dementia in other diseases classified elsewhere without behavioral disturbance: Secondary | ICD-10-CM

## 2014-11-20 NOTE — Assessment & Plan Note (Signed)
Better with regular tid alprazolam Even sleeping better though off the temazepam Actually better without the seroquel

## 2014-11-20 NOTE — Assessment & Plan Note (Signed)
She resisted my exam Hard to examine today---she resists I don't think her spell last week was cardiac--no clear orthostatic symptoms (only briefly on feet though)

## 2014-11-20 NOTE — Assessment & Plan Note (Signed)
PPS 30% Barely stands to pivot (and didn't last week) Rare words Incontinent mostly  Needs to be fed Still appropriate for hospice-- and will help husband avoid interventions that aren't a great idea (like ER visits, et)

## 2014-11-20 NOTE — Assessment & Plan Note (Signed)
I believe her decompensation last week was another stroke Seems to have bounced back some BP fine Will continue the aspirin

## 2014-11-20 NOTE — Assessment & Plan Note (Signed)
Eating some better but still not great They do work hard to get her to eat enough

## 2014-11-20 NOTE — Progress Notes (Signed)
Subjective:    Patient ID: Anne [name], female    DOB: 1916-09-07, 81 y.o.   MRN: [id number]  HPI Husband is here and caregiver Becky  Improved since spell last week Did get 5 nebulizer treatments for some respiratory issues Was unresponsive for days but did pull out of it  Legs have gotten weaker Was just about bed bound last week Can briefly stand again to transfer to potty chair and wheelchair Will occasionally indicate her need to use bathroom--- still has incontinence often Still needs to be fed  Appetite is better off the seroquel Mind seems better Still gets afternoon agitation--will call out "[employer]" Very few other words Alprazolam does help this--now getting every day regularly  Sleeping better with alprazolam and tramadol Hasn't been getting the temazepam  Sugars still running high 200-300 AM and 300-400 evening Hasn't needed the R Discussed increasing insulin to 15 units (the lantus)  Current Outpatient Prescriptions on File Prior to Visit  Medication Sig Dispense Refill  . acetaminophen (TYLENOL) 325 MG tablet Take 650 mg by mouth every 6 (six) hours as needed for mild pain.    Marland Kitchen ALPRAZolam (XANAX) 0.25 MG tablet Take 0.25 mg by mouth 3 (three) times daily.     Marland Kitchen bismuth subsalicylate (PEPTO BISMOL) 262 MG/15ML suspension Take 30 mLs by mouth every 4 (four) hours as needed for indigestion or diarrhea or loose stools. 360 mL 0  . [facility] ASPIRIN CHILD 81 MG chewable tablet TAKE 1 TABLET BY MOUTH DAILY**TO BE CRUSHED BY FAMILY** 36 tablet 0  . insulin glargine (LANTUS) 100 UNIT/ML injection Use as directed to inject 6 units daily dx:E11.9 (Patient taking differently: Inject 15 Units into the skin daily. ) 10 mL 11  . insulin regular (NOVOLIN R,HUMULIN R) 100 units/mL injection Inject 5 Units into the skin daily as needed for high blood sugar. If sugar over 400    . magnesium hydroxide (MILK OF MAGNESIA) 400 MG/5ML suspension Take 5 mLs by mouth daily as  needed for mild constipation.    Marland Kitchen omeprazole (PRILOSEC) 20 MG capsule Take 20 mg by mouth daily.    Marland Kitchen OVER THE COUNTER MEDICATION Take 1 Can by mouth daily. Boost    . polyethylene glycol (MIRALAX / GLYCOLAX) packet Take 17 g by mouth daily.    . traMADol (ULTRAM) 50 MG tablet Take 50 mg by mouth every 6 (six) hours as needed for moderate pain.     No current facility-administered medications on file prior to visit.    No Known Allergies  Past Medical History  Diagnosis Date  . Diabetes mellitus   . Alzheimer's dementia   . Thyroid disease   . Hypertension   . Murmur, heart 10/13/2013    Past Surgical History  Procedure Laterality Date  . Abdominal hysterectomy  1946    partial  . Hip arthroplasty Left 10/14/2013    Procedure: ARTHROPLASTY BIPOLAR HIP;  Surgeon: Nadara Mustard, MD;  Location: [facility];  Service: Orthopedics;  Laterality: Left;    Family History  Problem Relation Age of Onset  . Stroke Mother   . Other Father     accident    History   Social History  . Marital Status: Married    Spouse Name: N/A    Number of Children: N/A  . Years of Education: N/A   Occupational History  . [employer] for [employer], then worked at [employer]     retired   Social History Main  Topics  . Smoking status: Never Smoker   . Smokeless tobacco: Never Used  . Alcohol Use: No  . Drug Use: No  . Sexual Activity: No   Other Topics Concern  . Not on file   Social History Narrative   Has living will   Husband is health care POA--- son after   Has DNR order   No tube feeds    Review of Systems Weight seems stable since my last visit Not really constipated but will seem to strain at times Occasional cough--and seems to have to clear throat. Some choking with liquids. They slightly thicken liquids--not quite to nectar stage No skin redness or ulcers    Objective:   Physical Exam  Constitutional: No distress.  Neck: No thyromegaly present.  Cardiovascular: Exam  reveals no gallop.   No murmur heard. Distant but regular  Pulmonary/Chest: Effort normal. No respiratory distress. She has no wheezes. She has no rales.  Decreased breath sounds but clear  Abdominal: Soft. There is no tenderness.  Musculoskeletal: She exhibits no edema or tenderness.  Lymphadenopathy:    She has no cervical adenopathy.  Neurological:  Increased tone in extremities Resists me in exam and pushes away--mostly with left arm Rare words--mostly "Bill"  Psychiatric:  Calm until my exam--then some agitation          Assessment & Plan:

## 2014-11-20 NOTE — Assessment & Plan Note (Signed)
Running high Palliative approach is to avoid hypoglycemia and have some reasonable control Would like to see AM sugars 150-250 at most Will increase lantus to 15 units daily

## 2014-11-27 ENCOUNTER — Other Ambulatory Visit: Payer: Self-pay | Admitting: *Deleted

## 2014-11-27 MED ORDER — ALPRAZOLAM 0.25 MG PO TABS: 0.2500 mg | ORAL_TABLET | Freq: Three times a day (TID) | ORAL | Status: DC

## 2014-12-24 ENCOUNTER — Other Ambulatory Visit: Payer: Self-pay | Admitting: Internal Medicine

## 2014-12-24 NOTE — Telephone Encounter (Signed)
Approved: #90 x 0 Check with pharmacist---I think the hospice nurse already phoned this in

## 2014-12-24 NOTE — Telephone Encounter (Signed)
Spoke to pharmacist Tobi Bastos(Anna) and was advised that she is working with the Hospice to get this filled.

## 2014-12-24 NOTE — Telephone Encounter (Signed)
Last office visit 11/20/2014.  Last refilled  11/27/2014 for #90 with no refills.  Ok to refill?

## 2014-12-26 ENCOUNTER — Telehealth: Payer: Self-pay | Admitting: Internal Medicine

## 2014-12-26 NOTE — Telephone Encounter (Signed)
Notified of abnormal urine by Lupita Leashonna RN >50 WBC Not that clean catch Husband very concerned though not clear she has infection Will treat with 3 days of septra SS 1 bid

## 2014-12-27 ENCOUNTER — Encounter: Payer: Self-pay | Admitting: Internal Medicine

## 2015-01-03 ENCOUNTER — Other Ambulatory Visit: Payer: Self-pay | Admitting: Internal Medicine

## 2015-01-29 ENCOUNTER — Encounter: Payer: Self-pay | Admitting: Internal Medicine

## 2015-01-29 ENCOUNTER — Ambulatory Visit: Payer: Medicare Other | Admitting: Internal Medicine

## 2015-01-29 VITALS — BP 84/54 | HR 78 | Resp 20

## 2015-01-29 DIAGNOSIS — E43 Unspecified severe protein-calorie malnutrition: Secondary | ICD-10-CM

## 2015-01-29 DIAGNOSIS — M24529 Contracture, unspecified elbow: Secondary | ICD-10-CM | POA: Insufficient documentation

## 2015-01-29 DIAGNOSIS — E119 Type 2 diabetes mellitus without complications: Secondary | ICD-10-CM | POA: Diagnosis not present

## 2015-01-29 DIAGNOSIS — I350 Nonrheumatic aortic (valve) stenosis: Secondary | ICD-10-CM | POA: Diagnosis not present

## 2015-01-29 DIAGNOSIS — F0280 Dementia in other diseases classified elsewhere without behavioral disturbance: Secondary | ICD-10-CM

## 2015-01-29 DIAGNOSIS — G309 Alzheimer's disease, unspecified: Secondary | ICD-10-CM

## 2015-01-29 DIAGNOSIS — I639 Cerebral infarction, unspecified: Secondary | ICD-10-CM | POA: Diagnosis not present

## 2015-01-29 DIAGNOSIS — R451 Restlessness and agitation: Secondary | ICD-10-CM

## 2015-01-29 NOTE — Assessment & Plan Note (Signed)
No chest pain or apparent dizziness

## 2015-01-29 NOTE — Assessment & Plan Note (Signed)
Further wasting and probably weight loss Caregivers try to get her to eat and take supplements--but often limited success

## 2015-01-29 NOTE — Assessment & Plan Note (Signed)
Was clearly worse---probably related to GI illness Alprazolam need has gone back down again in the past couple of days

## 2015-01-29 NOTE — Assessment & Plan Note (Signed)
Early contractures in elbows but not hands or LE Caregivers try to work on ROM

## 2015-01-29 NOTE — Assessment & Plan Note (Signed)
Sugars have been lower Not using the regular insulin Will hold lantus if sugar under 150

## 2015-01-29 NOTE — Assessment & Plan Note (Signed)
Remains total care Clear decline with recent GI illness Not transfering well---should consider Huntley DecSara lift

## 2015-01-29 NOTE — Progress Notes (Signed)
Subjective:    Patient ID: Anne [name], female    DOB: [birth year], 81 y.o.   MRN: [id number]  HPI Husband here  Anne [name] --hospice nurse Caregivers-- Anne [name] and Anne [name]  Several recent problems Husband needed hospitalization also---atrial fib  Has had diarrhea starting a couple of weeks ago Seemed to finally improve Yesterday she had watery, slimy stool again. No blood No abdominal pain Appetite has clearly decreased--only good for breakfast Discussed trying activia yogurt for probiotic  Seems to have increased weakness No longer standing and supporting her weight for transfers  Will occasional ask for toilet--but still has incontinence at times Needs care for bathing and dressing May pick up fork if food put on it---but generally needs to be fed  Has had more agitation Alprazolam needed to be increased This has been variable Has taken on the fetal position in bed lately also Verbal ability has dropped quite a bit Less understandable words and sentences don't make sense  Sugars have been lower Insulin held 3 nights due to sugars ~120 in past week Hasn't needed the regular (short acting insulin)  Current Outpatient Prescriptions on File Prior to Visit  Medication Sig Dispense Refill  . acetaminophen (TYLENOL) 325 MG tablet Take 650 mg by mouth every 6 (six) hours as needed for mild pain.    Marland Kitchen. albuterol (2.5 MG/3ML) 0.083% NEBU 3 mL, albuterol (5 MG/ML) 0.5% NEBU 0.5 mL Inhale 2.5 mg into the lungs every 4 (four) hours.    . ALPRAZolam (XANAX) 0.25 MG tablet Take 1 tablet (0.25 mg total) by mouth 3 (three) times daily. (Patient taking differently: Take 0.25 mg by mouth 3 (three) times daily. Can give 1.5 tabs if very agitated) 90 tablet 0  . [facility] ASPIRIN CHILD 81 MG chewable tablet TAKE 1 TABLET BY MOUTH DAILY**TO BE CRUSHED BY FAMILY** 36 tablet 0  . insulin glargine (LANTUS) 100 UNIT/ML injection Use as directed to inject 6 units daily dx:E11.9 (Patient taking  differently: Inject 15 Units into the skin daily. ) 10 mL 11  . magnesium hydroxide (MILK OF MAGNESIA) 400 MG/5ML suspension Take 5 mLs by mouth daily as needed for mild constipation.    Marland Kitchen. omeprazole (PRILOSEC) 20 MG capsule Take 20 mg by mouth daily.    Marland Kitchen. OVER THE COUNTER MEDICATION Take 1 Can by mouth daily. Boost    . polyethylene glycol (MIRALAX / GLYCOLAX) packet Take 17 g by mouth daily as needed.     . traMADol (ULTRAM) 50 MG tablet Take 50 mg by mouth every 6 (six) hours as needed for moderate pain.     No current facility-administered medications on file prior to visit.    No Known Allergies  Past Medical History  Diagnosis Date  . Diabetes mellitus   . Alzheimer's dementia   . Thyroid disease   . Hypertension   . Murmur, heart 10/13/2013    Past Surgical History  Procedure Laterality Date  . Abdominal hysterectomy  1946    partial  . Hip arthroplasty Left 10/14/2013    Procedure: ARTHROPLASTY BIPOLAR HIP;  Surgeon: Anne MustardMarcus V Duda, MD;  Location: [facility];  Service: Orthopedics;  Laterality: Left;    Family History  Problem Relation Age of Onset  . Stroke Mother   . Other Father     accident    History   Social History  . Marital Status: Married    Spouse Name: N/A  . Number of Children: N/A  . Years of Education: N/A  Occupational History  . [employer]ben [employer] for [employer], then worked at [employer]     retired   Social History Main Topics  . Smoking status: Never Smoker   . Smokeless tobacco: Never Used  . Alcohol Use: No  . Drug Use: No  . Sexual Activity: No   Other Topics Concern  . Not on file   Social History Narrative   Has living will   Husband is health care POA--- son after   Has DNR order   No tube feeds    Review of Systems No obvious arthritic pain--hasn't needed tramadol in some time Sleeps okay most of the time No cough or breathing problems Heartburn seems to be controlled with the prilosec No skin ulcers noted    Objective:    Physical Exam  Constitutional: No distress.  Clear wasting  Neck: No thyromegaly present.  Cardiovascular: Normal rate and regular rhythm.  Exam reveals no gallop.   Gr 2/6 aortic systolic murmur  Pulmonary/Chest: Effort normal and breath sounds normal. No respiratory distress. She has no wheezes. She has no rales.  Abdominal: Soft. There is no tenderness.  Musculoskeletal: She exhibits no edema.  Mild contracture of elbows  Lymphadenopathy:    She has no cervical adenopathy.  Neurological: She exhibits normal muscle tone.  Mild agitation--pushes me away and resists exam  Skin: No rash noted.          Assessment & Plan:

## 2015-01-29 NOTE — Assessment & Plan Note (Signed)
Not sure if she may have had another stroke with the decline in speech and not standing well On ASA BP low--but hard to measure with her resisting

## 2015-03-21 ENCOUNTER — Telehealth: Payer: Self-pay

## 2015-03-21 NOTE — Telephone Encounter (Signed)
Anne [name] pts son said pt's husband is trying to get a reverse mortgage and lender is requiring a letter stating the date pt was diagnosed with dementia. Anne [name] said pt was seen by Dr Clelia CroftShaw then and dx was made prior to 2005. Lender wants documentation that POA was done prior to dementia dx. [name] request cb next week.

## 2015-03-22 NOTE — Telephone Encounter (Signed)
When was the POA done? I can only write a letter about the diagnosis, not anything else though. Do I only need to say that the diagnosis was made ~2005?

## 2015-03-24 NOTE — Telephone Encounter (Signed)
.  left message to have patient's son return my call.  

## 2015-03-24 NOTE — Telephone Encounter (Signed)
Dustin FolksBilly Bodiford returned Dee's call.  Please call him back at [phone].

## 2015-03-24 NOTE — Telephone Encounter (Signed)
.  left message to have patient return my call.  

## 2015-03-25 NOTE — Telephone Encounter (Signed)
Spoke with son, and he will go back to bank to get the POA, he's not sure of the date. Per son the POA needs to be before the dementia diagnosis, which he is sure it was. Son will drop off copy of POA.

## 2015-03-25 NOTE — Telephone Encounter (Signed)
Please call Dr Alver FisherShaw's office to see if they have a date when the dementia diagnosis was made

## 2015-03-26 NOTE — Telephone Encounter (Signed)
What office is Dr. Clelia CroftShaw in? It looks like he's a hospital dr, are you familiar with him?

## 2015-03-27 ENCOUNTER — Encounter: Payer: Self-pay | Admitting: Internal Medicine

## 2015-03-27 DIAGNOSIS — Z7689 Persons encountering health services in other specified circumstances: Secondary | ICD-10-CM

## 2015-03-27 NOTE — Telephone Encounter (Signed)
Letter done $20 charge 

## 2015-03-27 NOTE — Telephone Encounter (Signed)
Thanks for getting that information I do not see any of the forms on my desk.Anne [name].Anne [name]..Anne [name]

## 2015-03-27 NOTE — Telephone Encounter (Signed)
Sorry it's on your desk now

## 2015-03-27 NOTE — Telephone Encounter (Signed)
Called [facility] Dr. Alver FisherShaw's office [phone] and spoke with beth she gave me a date of a mini mental exam in 2007 and diagnosis 2008, that's as far as their records go back. POA forms on your desk.

## 2015-03-28 NOTE — Telephone Encounter (Signed)
Patient's son,[name],notifed letter ready for pick up and of $20 charge.

## 2015-04-16 ENCOUNTER — Encounter: Payer: Self-pay | Admitting: Internal Medicine

## 2015-04-16 ENCOUNTER — Ambulatory Visit: Payer: Medicare Other | Admitting: Internal Medicine

## 2015-04-16 VITALS — BP 112/50 | HR 66 | Resp 18

## 2015-04-16 DIAGNOSIS — F39 Unspecified mood [affective] disorder: Secondary | ICD-10-CM

## 2015-04-16 DIAGNOSIS — I350 Nonrheumatic aortic (valve) stenosis: Secondary | ICD-10-CM

## 2015-04-16 DIAGNOSIS — G309 Alzheimer's disease, unspecified: Secondary | ICD-10-CM | POA: Diagnosis not present

## 2015-04-16 DIAGNOSIS — R451 Restlessness and agitation: Secondary | ICD-10-CM | POA: Diagnosis not present

## 2015-04-16 DIAGNOSIS — I639 Cerebral infarction, unspecified: Secondary | ICD-10-CM | POA: Diagnosis not present

## 2015-04-16 DIAGNOSIS — E119 Type 2 diabetes mellitus without complications: Secondary | ICD-10-CM

## 2015-04-16 DIAGNOSIS — F0280 Dementia in other diseases classified elsewhere without behavioral disturbance: Secondary | ICD-10-CM

## 2015-04-16 DIAGNOSIS — E43 Unspecified severe protein-calorie malnutrition: Secondary | ICD-10-CM

## 2015-04-16 NOTE — Progress Notes (Signed)
Subjective:    Patient ID: Anne [name], female    DOB: 1915/11/28, 81 y.o.   MRN: [id number]  HPI Home visit for follow up of dementia and other medical conditions Husband and caregiver Kriste Basque are here Reviewed status with Lupita Leash RN  Still highly variable--"like a roller coaster" Concern about too much alprazolam--tried holding it some days Within 3 days, was agitated, hitting pinching, etc Gets 1/2 in AM, 1.5 bid after that Wasn't taking naps and sleeping poorly at night-is doing slightly better back on the med  Very weak Won't lock legs to stand--so transfers are more trickly Only 1 aide comfortable with letting her help---other aide just picks her up May be close to bedrest only May need to move bed to larger room to facilitate use of a lift  Talks at times Mostly confused Rarely comprehensible speech Talks about "mama"---- will call aide "Mama" or "Bill"  Did have change in appetite Increased for a while Now eating good breakfast--not much at lunch or supper Gets 1 boost a day  Resisting getting sugars checked Now down to once a week 118, 223 (post prandial), 198 No more low sugar reactions  Seems to have pain in right leg Resists moving it with transfers or on commode Gets the tramadol at night  Urine stays malodorous No hematuria or fever Does improve with more PO intake  Current Outpatient Prescriptions on File Prior to Visit  Medication Sig Dispense Refill  . acetaminophen (TYLENOL) 325 MG tablet Take 650 mg by mouth every 6 (six) hours as needed for mild pain.    Marland Kitchen albuterol (2.5 MG/3ML) 0.083% NEBU 3 mL, albuterol (5 MG/ML) 0.5% NEBU 0.5 mL Inhale 2.5 mg into the lungs every 4 (four) hours.    . ALPRAZolam (XANAX) 0.25 MG tablet Take 1 tablet (0.25 mg total) by mouth 3 (three) times daily. (Patient taking differently: Take 0.25 mg by mouth 3 (three) times daily. Can give 1.5 tabs if very agitated) 90 tablet 0  . [facility] ASPIRIN CHILD 81 MG chewable tablet  TAKE 1 TABLET BY MOUTH DAILY**TO BE CRUSHED BY FAMILY** 36 tablet 0  . insulin glargine (LANTUS) 100 UNIT/ML injection Use as directed to inject 6 units daily dx:E11.9 (Patient taking differently: Inject 15 Units into the skin daily. ) 10 mL 11  . magnesium hydroxide (MILK OF MAGNESIA) 400 MG/5ML suspension Take 5 mLs by mouth daily as needed for mild constipation.    Marland Kitchen omeprazole (PRILOSEC) 20 MG capsule Take 20 mg by mouth daily.    Marland Kitchen OVER THE COUNTER MEDICATION Take 1 Can by mouth daily. Boost    . polyethylene glycol (MIRALAX / GLYCOLAX) packet Take 17 g by mouth daily as needed.     . traMADol (ULTRAM) 50 MG tablet Take 50 mg by mouth every 6 (six) hours as needed for moderate pain.     No current facility-administered medications on file prior to visit.    No Known Allergies  Past Medical History  Diagnosis Date  . Diabetes mellitus   . Alzheimer's dementia   . Thyroid disease   . Hypertension   . Murmur, heart 10/13/2013    Past Surgical History  Procedure Laterality Date  . Abdominal hysterectomy  1946    partial  . Hip arthroplasty Left 10/14/2013    Procedure: ARTHROPLASTY BIPOLAR HIP;  Surgeon: Nadara Mustard, MD;  Location: [facility];  Service: Orthopedics;  Laterality: Left;    Family History  Problem Relation Age of Onset  .  Stroke Mother   . Other Father     accident    History   Social History  . Marital Status: Married    Spouse Name: N/A  . Number of Children: N/A  . Years of Education: N/A   Occupational History  . [employer]ben [employer] for [employer], then worked at [employer]     retired   Social History Main Topics  . Smoking status: Never Smoker   . Smokeless tobacco: Never Used  . Alcohol Use: No  . Drug Use: No  . Sexual Activity: No   Other Topics Concern  . Not on file   Social History Narrative   Has living will   Husband is health care POA--- son after   Has DNR order   No tube feeds    Review of Systems  Husband has ongoing health  issues--he seems to have lost weight Bowels are okay No skin breakdown Easy bruising--fights getting the insulin daily    Objective:   Physical Exam  Constitutional:  wasted  Neck: No thyromegaly present.  Cardiovascular: Normal rate and regular rhythm.   Systolic murmur radiates to back  Pulmonary/Chest: Effort normal and breath sounds normal. No respiratory distress. She has no wheezes. She has no rales.  Abdominal: Soft. There is no tenderness.  Lymphadenopathy:    She has no cervical adenopathy.  Neurological:  Some stiffness in extremities Fights exam a little (like getting BP) Single words--"mama" , etc  Skin: No rash noted. No erythema.  Psychiatric:  Mild resistance          Assessment & Plan:

## 2015-04-16 NOTE — Assessment & Plan Note (Addendum)
No longer helps with transfers Should start with lift for her safety, and that of her caregivers Huntley Dec(Sara would be preferable) Total care incontinent

## 2015-04-16 NOTE — Assessment & Plan Note (Signed)
Will increase boost to 2 daily

## 2015-04-16 NOTE — Assessment & Plan Note (Signed)
Some underlying anxiety but mostly related to dementia Will continue the alprazolam

## 2015-04-16 NOTE — Assessment & Plan Note (Signed)
Continues Didn't do well off the alprazolam

## 2015-04-16 NOTE — Assessment & Plan Note (Signed)
No clear change On ASA

## 2015-04-16 NOTE — Assessment & Plan Note (Signed)
Reasonably controlled given the clinical situation No hypoglycemia that we can tell

## 2015-04-16 NOTE — Assessment & Plan Note (Signed)
No apparent pain No dizziness that we can tell

## 2015-04-26 IMAGING — DX DG CHEST 1V PORT
1 series · 1 of 1 positions shown · non-contrast
Comparison: 01/26/1949

CLINICAL DATA: Initial evaluation Pt presented from home via EMS,
pt on hospice care, bed ridden, report of recently being taken off
glipizide by her care team for DM type 2. Family report change in
mental status from her dementia baseline. EMS glucometer reports
High. Pt lethargic at this time.

EXAM:
PORTABLE CHEST - 1 VIEW

[chest ap]
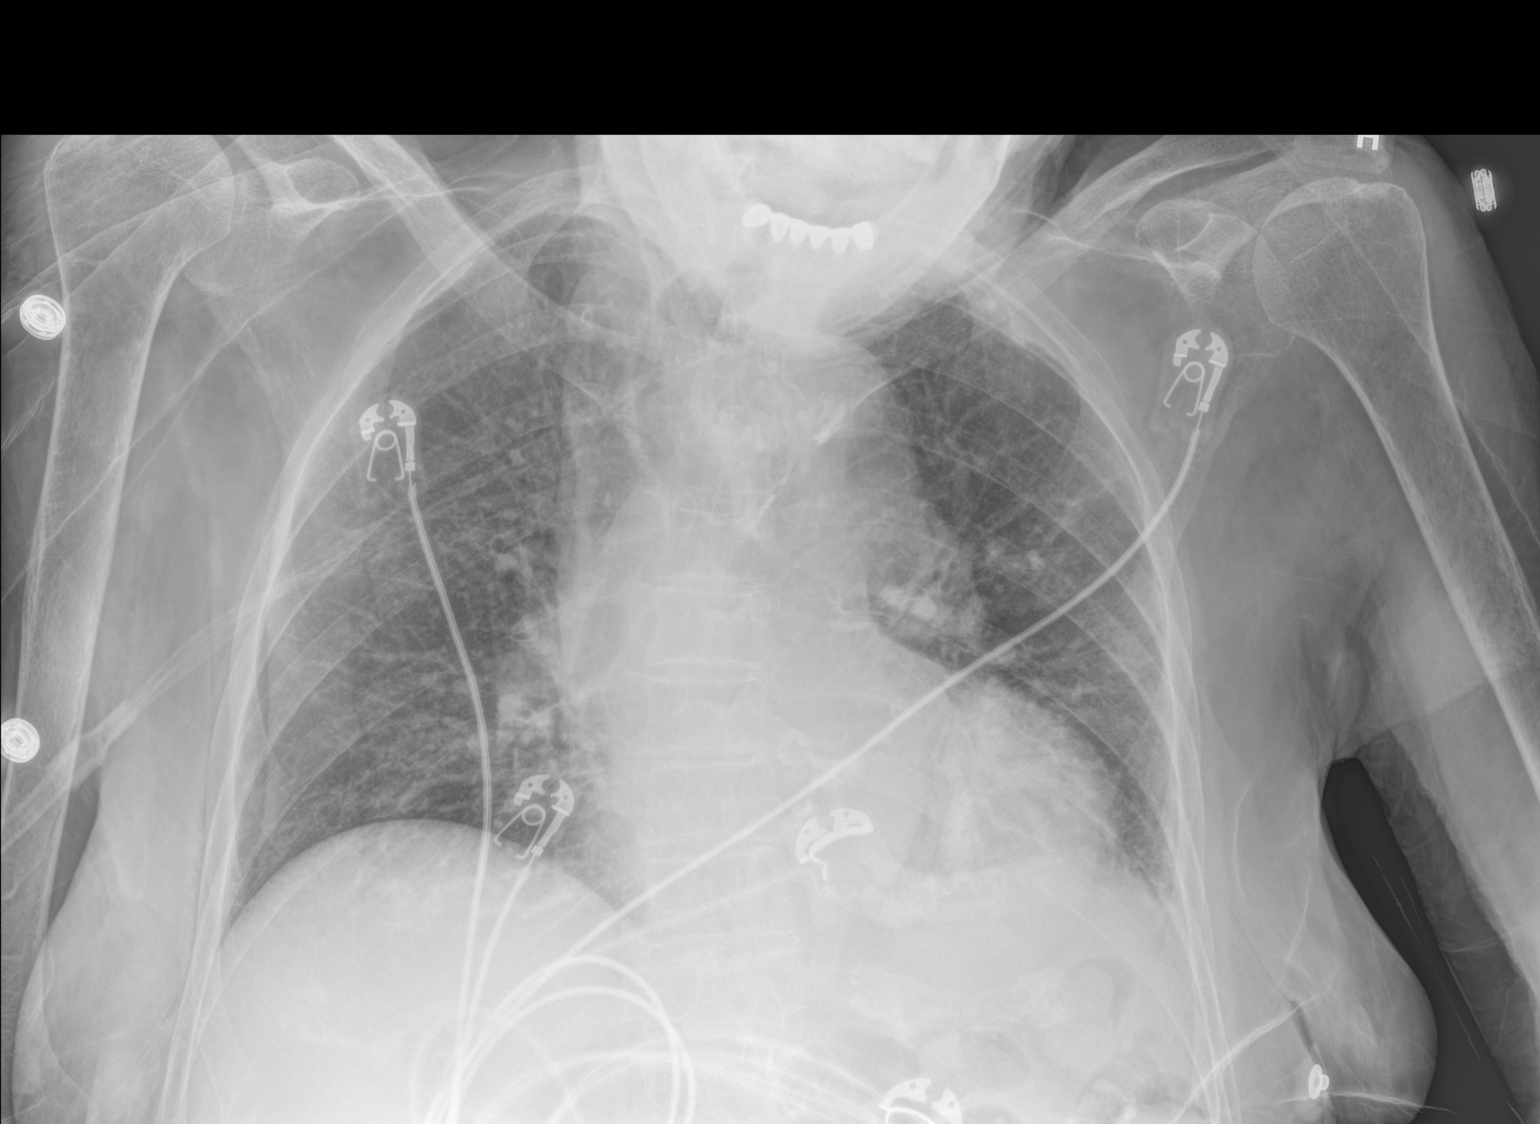

[1 of 1 positions shown; findings below may reference images not displayed]

FINDINGS: Stable mild cardiac enlargement. Stable uncoiling and calcification
of the aorta. Vascular pattern is normal. Right lung is clear. Mild
left lower lobe atelectasis similar to prior study.
IMPRESSION: No significant acute findings.

## 2015-05-03 ENCOUNTER — Other Ambulatory Visit: Payer: Self-pay | Admitting: Internal Medicine

## 2015-05-20 DIAGNOSIS — R634 Abnormal weight loss: Secondary | ICD-10-CM

## 2015-05-20 DIAGNOSIS — E119 Type 2 diabetes mellitus without complications: Secondary | ICD-10-CM

## 2015-05-20 DIAGNOSIS — I359 Nonrheumatic aortic valve disorder, unspecified: Secondary | ICD-10-CM

## 2015-05-20 DIAGNOSIS — R1310 Dysphagia, unspecified: Secondary | ICD-10-CM

## 2015-05-20 DIAGNOSIS — G309 Alzheimer's disease, unspecified: Secondary | ICD-10-CM | POA: Diagnosis not present

## 2015-05-20 DIAGNOSIS — E46 Unspecified protein-calorie malnutrition: Secondary | ICD-10-CM | POA: Diagnosis not present

## 2015-05-20 DIAGNOSIS — I10 Essential (primary) hypertension: Secondary | ICD-10-CM

## 2015-06-10 ENCOUNTER — Other Ambulatory Visit: Payer: Self-pay | Admitting: Internal Medicine

## 2015-07-02 ENCOUNTER — Encounter: Payer: Self-pay | Admitting: Internal Medicine

## 2015-07-02 ENCOUNTER — Ambulatory Visit: Payer: Medicare Other | Admitting: Internal Medicine

## 2015-07-02 VITALS — BP 112/62 | HR 68 | Resp 16

## 2015-07-02 DIAGNOSIS — I350 Nonrheumatic aortic (valve) stenosis: Secondary | ICD-10-CM

## 2015-07-02 DIAGNOSIS — I639 Cerebral infarction, unspecified: Secondary | ICD-10-CM

## 2015-07-02 DIAGNOSIS — F0280 Dementia in other diseases classified elsewhere without behavioral disturbance: Secondary | ICD-10-CM

## 2015-07-02 DIAGNOSIS — G309 Alzheimer's disease, unspecified: Secondary | ICD-10-CM | POA: Diagnosis not present

## 2015-07-02 DIAGNOSIS — E119 Type 2 diabetes mellitus without complications: Secondary | ICD-10-CM | POA: Diagnosis not present

## 2015-07-02 DIAGNOSIS — E43 Unspecified severe protein-calorie malnutrition: Secondary | ICD-10-CM

## 2015-07-02 DIAGNOSIS — F39 Unspecified mood [affective] disorder: Secondary | ICD-10-CM

## 2015-07-02 DIAGNOSIS — Z23 Encounter for immunization: Secondary | ICD-10-CM | POA: Diagnosis not present

## 2015-07-02 DIAGNOSIS — M159 Polyosteoarthritis, unspecified: Secondary | ICD-10-CM

## 2015-07-02 MED ORDER — ALPRAZOLAM 0.25 MG PO TABS: 0.2500 mg | ORAL_TABLET | Freq: Three times a day (TID) | ORAL | Status: AC | PRN

## 2015-07-02 NOTE — Assessment & Plan Note (Signed)
No obvious symptoms from this No a candidate for any Rx

## 2015-07-02 NOTE — Progress Notes (Signed)
Subjective:    Patient ID: Anne [name], female    DOB: 1915-11-22, 81 y.o.   MRN: [id number]  HPI Home visit  Husband here Caregiver Meagan and Meagan RN from hospice here Caregivers here 24/7  Has gotten hospital bed Gets OOB with maximal assistance of 2 Will occasionally note need for potty--- some success with program of regular commode Still incontinent regularly  Eating and drinking well Has to be fed totally  Did seem to be mottled after shower-- and SOB Will rarely get heavy breathing after eating or during transfers Some cough--increased lately No fever No choking and no clear aspiration Hasn't needed the inhalers  No apparent syncope upon arising No chest pain  Gets agitated at times Not only with care--- other times when just in bed No obvious pain Does get the tramadol at night Gets the alprazolam every 6 hours--- does seem to get antsy inbetween doses  Checking sugars once or twice a week Usually in 90's fasting, 200 at night Insulin decreased to 10 units daily  Current Outpatient Prescriptions on File Prior to Visit  Medication Sig Dispense Refill  . acetaminophen (TYLENOL) 325 MG tablet Take 650 mg by mouth every 6 (six) hours as needed for mild pain.    Marland Kitchen albuterol (2.5 MG/3ML) 0.083% NEBU 3 mL, albuterol (5 MG/ML) 0.5% NEBU 0.5 mL Inhale 2.5 mg into the lungs every 4 (four) hours.    . [facility] ASPIRIN CHILD 81 MG chewable tablet TAKE 1 TABLET BY MOUTH DAILY**TO BE CRUSHED BY FAMILY** 36 tablet 0  . insulin glargine (LANTUS) 100 UNIT/ML injection Inject 0.15 mLs (15 Units total) into the skin daily. DX: E11.9 (Patient taking differently: Inject 10 Units into the skin daily. DX: E11.9) 10 mL 11  . magnesium hydroxide (MILK OF MAGNESIA) 400 MG/5ML suspension Take 5 mLs by mouth daily as needed for mild constipation.    Marland Kitchen omeprazole (PRILOSEC) 20 MG capsule TAKE ONE CAPSULE BY MOUTH AT BEDTIME 90 capsule 3  . polyethylene glycol (MIRALAX / GLYCOLAX) packet  Take 17 g by mouth daily as needed.     . traMADol (ULTRAM) 50 MG tablet Take 50 mg by mouth every 6 (six) hours as needed for moderate pain.     No current facility-administered medications on file prior to visit.    No Known Allergies  Past Medical History  Diagnosis Date  . Diabetes mellitus   . Alzheimer's dementia   . Thyroid disease   . Hypertension   . Murmur, heart 10/13/2013    Past Surgical History  Procedure Laterality Date  . Abdominal hysterectomy  1946    partial  . Hip arthroplasty Left 10/14/2013    Procedure: ARTHROPLASTY BIPOLAR HIP;  Surgeon: Nadara Mustard, MD;  Location: [facility];  Service: Orthopedics;  Laterality: Left;    Family History  Problem Relation Age of Onset  . Stroke Mother   . Other Father     accident    Social History   Social History  . Marital Status: Married    Spouse Name: N/A  . Number of Children: N/A  . Years of Education: N/A   Occupational History  . [employer]ben [employer] for [employer], then worked at [employer]     retired   Social History Main Topics  . Smoking status: Never Smoker   . Smokeless tobacco: Never Used  . Alcohol Use: No  . Drug Use: No  . Sexual Activity: No   Other Topics Concern  .  Not on file   Social History Narrative   Has living will   Husband is health care POA--- son after   Has DNR order   No tube feeds    Review of Systems No obvious loss of weight Sleeps all night and variably during the day No skin breakdown Bowels have been fine    Objective:   Physical Exam  Constitutional: No distress.  Neck: No thyromegaly present.  Cardiovascular: Normal rate and regular rhythm.  Exam reveals no gallop.   Gr 4/6 aortic murmur heard in back and carotid  Pulmonary/Chest: Effort normal and breath sounds normal. No respiratory distress. She has no wheezes. She has no rales.  Musculoskeletal: She exhibits no edema.  Stiffness and apparent joint pain with passive ROM  Lymphadenopathy:    She has no  cervical adenopathy.  Neurological:  Somnolent Doesn't really talk today though sits up some and reaches for me when I touched her Seems to have increased tone in extremities  Skin: No rash noted. No erythema.          Assessment & Plan:

## 2015-07-02 NOTE — Addendum Note (Signed)
Addended by: Sueanne Margarita on: 07/02/2015 02:47 PM   Modules accepted: Orders

## 2015-07-02 NOTE — Assessment & Plan Note (Signed)
Agitated at times Discussed pain control Alprazolam should be prn only

## 2015-07-02 NOTE — Assessment & Plan Note (Signed)
Continued progression PPS- 30% Not out of bed much Total care including feeding Mostly incontinent Remains on hospice

## 2015-07-02 NOTE — Assessment & Plan Note (Signed)
May be causing some of the agitation Advised tid tylenol arthritis May want to try tramadol first when agitated

## 2015-07-02 NOTE — Assessment & Plan Note (Signed)
Eating better but still wasted

## 2015-07-02 NOTE — Assessment & Plan Note (Signed)
Just about bed bound now

## 2015-07-02 NOTE — Assessment & Plan Note (Signed)
Tends to run low fasting No obvious hypoglycemic reactions Will decrease lantus further if not eating or sugars are lower

## 2015-07-14 ENCOUNTER — Other Ambulatory Visit: Payer: Self-pay | Admitting: Internal Medicine

## 2015-09-10 ENCOUNTER — Encounter: Payer: Self-pay | Admitting: Internal Medicine

## 2015-09-10 ENCOUNTER — Ambulatory Visit: Admitting: Internal Medicine

## 2015-09-10 VITALS — BP 86/50 | HR 96 | Resp 24

## 2015-09-10 DIAGNOSIS — I639 Cerebral infarction, unspecified: Secondary | ICD-10-CM

## 2015-09-10 DIAGNOSIS — E43 Unspecified severe protein-calorie malnutrition: Secondary | ICD-10-CM

## 2015-09-10 DIAGNOSIS — F0280 Dementia in other diseases classified elsewhere without behavioral disturbance: Secondary | ICD-10-CM

## 2015-09-10 DIAGNOSIS — M159 Polyosteoarthritis, unspecified: Secondary | ICD-10-CM

## 2015-09-10 DIAGNOSIS — F39 Unspecified mood [affective] disorder: Secondary | ICD-10-CM

## 2015-09-10 DIAGNOSIS — G309 Alzheimer's disease, unspecified: Secondary | ICD-10-CM | POA: Diagnosis not present

## 2015-09-10 NOTE — Assessment & Plan Note (Signed)
End stage--on hospice Not clearly aware of husband's death--but wouldn't be surprised if she died soon Total care

## 2015-09-10 NOTE — Progress Notes (Signed)
Subjective:    Patient ID: Anne [name], female    DOB: 10-19-1916, 81 y.o.   MRN: [id number]  HPI Home visit for follow up of severe dementia Becky caregiver is here  Patient's husband died about a month or 2 ago She is not clearly aware of this--- still talks to him (delusional)  Continues on 24 hour care Basically bed bound Aides get her out of bed for meals--she sits at the table Bears a little weight for transfers--needs maximal assist ?still with focal right side weakness  Does seem worse overall since husband's death Not eating as well--less than half of what she used to Is fed--but refuses at times Will pick up coffee cup at times  Had worsening of her anxiety/mood in past Was on alprazolam tid---but now only needing occasionally  Aides check sugars once a week Usually in low 200's random--under 200 fasting Sticking with the 10 units of insulin  Has frequent but brief periods of heavy, fast breathing No fever or cough Seems to resolve quickly-- haven't used the roxanol as yet  Current Outpatient Prescriptions on File Prior to Visit  Medication Sig Dispense Refill  . acetaminophen (TYLENOL) 650 MG CR tablet Take 650 mg by mouth 3 (three) times daily.    Marland Kitchen albuterol (2.5 MG/3ML) 0.083% NEBU 3 mL, albuterol (5 MG/ML) 0.5% NEBU 0.5 mL Inhale 2.5 mg into the lungs every 4 (four) hours.    . ALPRAZolam (XANAX) 0.25 MG tablet Take 1 tablet (0.25 mg total) by mouth 3 (three) times daily as needed. Can give 1.5 tabs if very agitated 1 tablet 0  . [facility] ASPIRIN CHILD 81 MG chewable tablet TAKE 1 TABLET BY MOUTH DAILY**TO BE CRUSHED BY FAMILY** 36 tablet 0  . insulin glargine (LANTUS) 100 UNIT/ML injection Inject 0.1 mLs (10 Units total) into the skin daily. Dx: E11.9 10 mL 11  . magnesium hydroxide (MILK OF MAGNESIA) 400 MG/5ML suspension Take 5 mLs by mouth daily as needed for mild constipation.    Marland Kitchen omeprazole (PRILOSEC) 20 MG capsule TAKE ONE CAPSULE BY MOUTH AT BEDTIME  90 capsule 3  . polyethylene glycol (MIRALAX / GLYCOLAX) packet Take 17 g by mouth daily as needed.     . traMADol (ULTRAM) 50 MG tablet Take 50 mg by mouth every 6 (six) hours as needed for moderate pain.     No current facility-administered medications on file prior to visit.    No Known Allergies  Past Medical History  Diagnosis Date  . Diabetes mellitus   . Alzheimer's dementia   . Thyroid disease   . Hypertension   . Murmur, heart 10/13/2013    Past Surgical History  Procedure Laterality Date  . Abdominal hysterectomy  1946    partial  . Hip arthroplasty Left 10/14/2013    Procedure: ARTHROPLASTY BIPOLAR HIP;  Surgeon: Nadara Mustard, MD;  Location: [facility];  Service: Orthopedics;  Laterality: Left;    Family History  Problem Relation Age of Onset  . Stroke Mother   . Other Father     accident    Social History   Social History  . Marital Status: Married    Spouse Name: N/A  . Number of Children: N/A  . Years of Education: N/A   Occupational History  . [employer]ben [employer] for [employer], then worked at [employer]     retired   Social History Main Topics  . Smoking status: Never Smoker   . Smokeless tobacco: Never Used  .  Alcohol Use: No  . Drug Use: No  . Sexual Activity: No   Other Topics Concern  . Not on file   Social History Narrative   Has living will   Husband is health care POA--- son after   Has DNR order   No tube feeds    Review of Systems May have had some pain--tylenol usually works for her pain Sleeping much of the time Bowels are slow--then too much if gives small dose of MOM. On the miralax daily Incontinent of bowel and bladder No skin breakdown    Objective:   Physical Exam  Constitutional:  Doesn't engage much In bed--cachectic  Neck: No thyromegaly present.  Cardiovascular: Normal rate and regular rhythm.  Exam reveals no gallop.   Gr 3/6 aortic systolic murmur  Pulmonary/Chest: Effort normal and breath sounds normal. No respiratory  distress. She has no wheezes. She has no rales.  Abdominal: Soft. There is no tenderness.  Musculoskeletal: She exhibits no edema.  Lymphadenopathy:    She has no cervical adenopathy.  Neurological:  Very little active movement  Psychiatric:  Anxious and resists exam to some degree          Assessment & Plan:

## 2015-09-10 NOTE — Assessment & Plan Note (Signed)
Low dose lantus is keeping her from having any serious hyperglycemia Goal is under 250 without hypoglycemia No need to test frequently

## 2015-09-10 NOTE — Assessment & Plan Note (Signed)
Eating a lot less Clearly severe wasting

## 2015-09-10 NOTE — Assessment & Plan Note (Signed)
Doing okay with tylenol and nighttime tramadol

## 2015-09-10 NOTE — Assessment & Plan Note (Signed)
Still seems anxious --may be the reason for the tachypneic spells Not as agitated though Will try the alprazolam more regularly again--may need the roxanol for persistent dyspnea

## 2015-09-10 NOTE — Assessment & Plan Note (Signed)
Almost no active use of extremities now---occasionally hold a cup On asa

## 2015-10-13 ENCOUNTER — Telehealth: Payer: Self-pay | Admitting: Internal Medicine

## 2015-10-13 NOTE — Telephone Encounter (Signed)
Pts son, Dustin FolksBilly Brodrick, dropped of letter to be completed by Dr. Alphonsus SiasLetvak per [employer] request.   Request in Dr. Earnestine MealingLetvaks RX tower in-box.  Thank you.  CB # [phone]

## 2015-10-14 ENCOUNTER — Encounter: Payer: Self-pay | Admitting: Internal Medicine

## 2015-10-14 DIAGNOSIS — Z7689 Persons encountering health services in other specified circumstances: Secondary | ICD-10-CM

## 2015-10-14 NOTE — Telephone Encounter (Signed)
Letter done $20 charge 

## 2015-10-14 NOTE — Telephone Encounter (Signed)
Form on your desk  

## 2015-10-14 NOTE — Telephone Encounter (Signed)
I left a message on patient's son's voice mail that letter is ready for pick up and of charge.

## 2015-10-20 ENCOUNTER — Telehealth: Payer: Self-pay

## 2015-10-20 NOTE — Telephone Encounter (Signed)
Jan nurse with [facility] said pt is out of roxanol; Jan request rx for roxanol 100 mg per 5 ml with instructions 0.25 ml or 5 mg q2h prn for anxiety or SOB. This rx is over one year old. [facility] [place] [facility]

## 2015-10-20 NOTE — Telephone Encounter (Signed)
Rx written #30 ml x 0

## 2015-10-20 NOTE — Telephone Encounter (Signed)
Spoke with nurse and advised results rx faxed to pharmacy manually[facility] [place]

## 2015-12-02 ENCOUNTER — Telehealth: Payer: Self-pay | Admitting: Internal Medicine

## 2015-12-02 NOTE — Telephone Encounter (Signed)
Phone call from [facility] They are still checking sugars but always under 200 and not eating well Hasn't needed insulin  Okay to stop checking and discontinued the insulin

## 2015-12-10 ENCOUNTER — Ambulatory Visit: Admitting: Internal Medicine

## 2015-12-10 ENCOUNTER — Encounter: Payer: Self-pay | Admitting: Internal Medicine

## 2015-12-10 VITALS — BP 124/68 | HR 76 | Resp 24

## 2015-12-10 DIAGNOSIS — I639 Cerebral infarction, unspecified: Secondary | ICD-10-CM

## 2015-12-10 DIAGNOSIS — G309 Alzheimer's disease, unspecified: Secondary | ICD-10-CM | POA: Diagnosis not present

## 2015-12-10 DIAGNOSIS — E43 Unspecified severe protein-calorie malnutrition: Secondary | ICD-10-CM

## 2015-12-10 DIAGNOSIS — I700 Atherosclerosis of aorta: Secondary | ICD-10-CM | POA: Diagnosis not present

## 2015-12-10 DIAGNOSIS — M159 Polyosteoarthritis, unspecified: Secondary | ICD-10-CM

## 2015-12-10 DIAGNOSIS — F0280 Dementia in other diseases classified elsewhere without behavioral disturbance: Secondary | ICD-10-CM

## 2015-12-10 DIAGNOSIS — F39 Unspecified mood [affective] disorder: Secondary | ICD-10-CM | POA: Diagnosis not present

## 2015-12-10 DIAGNOSIS — IMO0001 Reserved for inherently not codable concepts without codable children: Secondary | ICD-10-CM

## 2015-12-10 NOTE — Assessment & Plan Note (Signed)
So little function now it is hard to tell about this Is on ASA daily

## 2015-12-10 NOTE — Assessment & Plan Note (Signed)
Agitated at times---looking for dead husband and sister, etc Discussed using the alprazolam if this is persistent

## 2015-12-10 NOTE — Assessment & Plan Note (Signed)
With sig stenosis No obvious symptoms though No action

## 2015-12-10 NOTE — Assessment & Plan Note (Signed)
Seems to be having more pain lately Asked them to restart the tylenol arthritis regularly (not prn)

## 2015-12-10 NOTE — Progress Notes (Signed)
Subjective:    Patient ID: Anne [name], female    DOB: January 11, 1916, 82 y.o.   MRN: [id number]  HPI Home visit for follow up of severe dementia Caregiver here Meagan RN from Hospice here  No longer checking sugars Never really above 200---so insulin stopped (was only prn)  Had bad day yesterday--not speaking, unable to pick up cup, etc More active today and speaks to me for the first time in a few visits Still not eating well--has to be fed Trying to feed her small quantities--- using small colorful bowl to stimulate her Seems to have still lost more weight  Out of bed twice a day for breakfast and dinner Able to be lifted for transfer Bed bath only Will occasionally say "I need to pee"---but mostly incontinent  Has heavy breathing often Morphine did seem to help once--but this often makes her itch (so not giving it nightly now as they had been) Discussed trying oxygen if she has the heavy breathing  Denies pain but caregivers note pain at times Will give roxanol then--tramadol seems to work better  Current Outpatient Prescriptions on File Prior to Visit  Medication Sig Dispense Refill  . acetaminophen (TYLENOL) 650 MG CR tablet Take 650 mg by mouth 3 (three) times daily as needed.     Marland Kitchen albuterol (2.5 MG/3ML) 0.083% NEBU 3 mL, albuterol (5 MG/ML) 0.5% NEBU 0.5 mL Inhale 2.5 mg into the lungs 4 (four) times daily as needed.     . ALPRAZolam (XANAX) 0.25 MG tablet Take 1 tablet (0.25 mg total) by mouth 3 (three) times daily as needed. Can give 1.5 tabs if very agitated 1 tablet 0  . [facility] ASPIRIN CHILD 81 MG chewable tablet TAKE 1 TABLET BY MOUTH DAILY**TO BE CRUSHED BY FAMILY** 36 tablet 0  . magnesium hydroxide (MILK OF MAGNESIA) 400 MG/5ML suspension Take 5 mLs by mouth daily as needed for mild constipation.    . polyethylene glycol (MIRALAX / GLYCOLAX) packet Take 17 g by mouth daily.     . traMADol (ULTRAM) 50 MG tablet Take 50 mg by mouth every 6 (six) hours as needed for  moderate pain.     No current facility-administered medications on file prior to visit.    No Known Allergies  Past Medical History  Diagnosis Date  . Diabetes mellitus   . Alzheimer's dementia   . Thyroid disease   . Hypertension   . Murmur, heart 10/13/2013    Past Surgical History  Procedure Laterality Date  . Abdominal hysterectomy  1946    partial  . Hip arthroplasty Left 10/14/2013    Procedure: ARTHROPLASTY BIPOLAR HIP;  Surgeon: Nadara Mustard, MD;  Location: [facility];  Service: Orthopedics;  Laterality: Left;    Family History  Problem Relation Age of Onset  . Stroke Mother   . Other Father     accident    Social History   Social History  . Marital Status: Married    Spouse Name: N/A  . Number of Children: N/A  . Years of Education: N/A   Occupational History  . [employer]ben [employer] for [employer], then worked at [employer]     retired   Social History Main Topics  . Smoking status: Never Smoker   . Smokeless tobacco: Never Used  . Alcohol Use: No  . Drug Use: No  . Sexual Activity: No   Other Topics Concern  . Not on file   Social History Narrative   Has living will  Husband is health care POA--- son after   Has DNR order   No tube feeds    Review of Systems No bed sores No rash Bowels fairly regular---gets miralax daily unless they are loose. Rarely needs some MOM Still calls out for husband--quite a bit (and for one of her sisters). Usually fairly short lived. But then may act like he is there then    Objective:   Physical Exam  Constitutional:  Wasting still  Neck: No thyromegaly present.  Cardiovascular: Normal rate and regular rhythm.  Exam reveals no gallop.   Loud murmur across precordium   Pulmonary/Chest: Effort normal and breath sounds normal. No respiratory distress. She has no wheezes. She has no rales.  Abdominal: Soft. There is no tenderness.  Musculoskeletal: She exhibits no edema.  No contractures  Lymphadenopathy:    She has no  cervical adenopathy.  Neurological:  Awake and says a few words to me Still resists exam mildly  Psychiatric:  Neutral Does answer some questions briefly today--this is not typical for my visits          Assessment & Plan:

## 2015-12-10 NOTE — Assessment & Plan Note (Signed)
Caregivers are trying various strategies but still seems to be losing weight

## 2015-12-10 NOTE — Assessment & Plan Note (Signed)
Total care Bed and chair bound Still hospice appropriate

## 2015-12-10 NOTE — Assessment & Plan Note (Signed)
Sugars never up so stopped checking and no insulin

## 2016-02-04 ENCOUNTER — Other Ambulatory Visit: Payer: Self-pay

## 2016-02-04 NOTE — Telephone Encounter (Signed)
Michelle with [facility] left v/m requesting refill tramadol for pt; pt will be out of med on 02/05/16. Michelle request cb.pt seen 12/10/15.

## 2016-02-05 MED ORDER — TRAMADOL HCL 50 MG PO TABS: 50.0000 mg | ORAL_TABLET | Freq: Four times a day (QID) | ORAL | Status: AC | PRN

## 2016-02-05 NOTE — Telephone Encounter (Signed)
Left refill on voice mail at pharmacy  

## 2016-02-05 NOTE — Telephone Encounter (Signed)
Approved: #[phone] x 1 

## 2016-02-18 ENCOUNTER — Encounter: Payer: Self-pay | Admitting: Internal Medicine

## 2016-02-18 ENCOUNTER — Ambulatory Visit: Admitting: Internal Medicine

## 2016-02-18 VITALS — BP 140/78 | HR 64 | Resp 24

## 2016-02-18 DIAGNOSIS — G309 Alzheimer's disease, unspecified: Secondary | ICD-10-CM

## 2016-02-18 DIAGNOSIS — F0280 Dementia in other diseases classified elsewhere without behavioral disturbance: Secondary | ICD-10-CM

## 2016-02-18 DIAGNOSIS — I639 Cerebral infarction, unspecified: Secondary | ICD-10-CM

## 2016-02-18 DIAGNOSIS — Z794 Long term (current) use of insulin: Secondary | ICD-10-CM

## 2016-02-18 DIAGNOSIS — K5909 Other constipation: Secondary | ICD-10-CM

## 2016-02-18 DIAGNOSIS — E43 Unspecified severe protein-calorie malnutrition: Secondary | ICD-10-CM

## 2016-02-18 DIAGNOSIS — IMO0002 Reserved for concepts with insufficient information to code with codable children: Secondary | ICD-10-CM

## 2016-02-18 DIAGNOSIS — F39 Unspecified mood [affective] disorder: Secondary | ICD-10-CM

## 2016-02-18 DIAGNOSIS — E118 Type 2 diabetes mellitus with unspecified complications: Secondary | ICD-10-CM

## 2016-02-18 DIAGNOSIS — E1165 Type 2 diabetes mellitus with hyperglycemia: Secondary | ICD-10-CM

## 2016-02-18 DIAGNOSIS — IMO0001 Reserved for inherently not codable concepts without codable children: Secondary | ICD-10-CM

## 2016-02-18 DIAGNOSIS — K5900 Constipation, unspecified: Secondary | ICD-10-CM

## 2016-02-18 DIAGNOSIS — I700 Atherosclerosis of aorta: Secondary | ICD-10-CM

## 2016-02-18 NOTE — Assessment & Plan Note (Signed)
No action appropriate about this

## 2016-02-18 NOTE — Assessment & Plan Note (Signed)
Still agitated at times Alprazolam does help

## 2016-02-18 NOTE — Progress Notes (Signed)
Subjective:    Patient ID: Anne [name], female    DOB: [birth year], 82 y.o.   MRN: [id number]  HPI Home visit for follow up of severe dementia Marcelino DusterMichelle RN here from hospice Caregiver here also---Meagan  Has been different recently Has been eating again Sugars have been up over 200 and at times over 300---usually 2 hours post prandial Caregivers did start lantus again at 6 units daily  Only gets up for breakfast and dinner. Lunch is in bed Transfers done with 1 person full lift Still occasionally states "I need to pee" ---but inconsistent and only uses diapers Needs to be fed Only bed bath and total other care Still has 24 hour care---down to 2 caregivers now and working on an alternate  Occasional agitation Gets the alprazolam 1/2 at night and sometimes a full tab Needs lunch dose at times  Heavy breathing is better Not needing the roxanol at all  Seems to have pain at times Tylenol is prn Gets tramadol every night and occasionally at other times  Current Outpatient Prescriptions on File Prior to Visit  Medication Sig Dispense Refill  . acetaminophen (TYLENOL) 650 MG CR tablet Take 650 mg by mouth 3 (three) times daily as needed.     Marland Kitchen. albuterol (2.5 MG/3ML) 0.083% NEBU 3 mL, albuterol (5 MG/ML) 0.5% NEBU 0.5 mL Inhale 2.5 mg into the lungs 4 (four) times daily as needed.     . ALPRAZolam (XANAX) 0.25 MG tablet Take 1 tablet (0.25 mg total) by mouth 3 (three) times daily as needed. Can give 1.5 tabs if very agitated 1 tablet 0  . [facility] ASPIRIN CHILD 81 MG chewable tablet TAKE 1 TABLET BY MOUTH DAILY**TO BE CRUSHED BY FAMILY** 36 tablet 0  . magnesium hydroxide (MILK OF MAGNESIA) 400 MG/5ML suspension Take 5 mLs by mouth daily as needed for mild constipation.    Marland Kitchen. morphine (ROXANOL) 20 MG/ML concentrated solution Take 5 mg by mouth every 2 (two) hours as needed for severe pain.    . polyethylene glycol (MIRALAX / GLYCOLAX) packet Take 17 g by mouth daily.     . traMADol  (ULTRAM) 50 MG tablet Take 1 tablet (50 mg total) by mouth every 6 (six) hours as needed for moderate pain. 60 tablet 1   No current facility-administered medications on file prior to visit.    No Known Allergies  Past Medical History  Diagnosis Date  . Diabetes mellitus   . Alzheimer's dementia   . Thyroid disease   . Hypertension   . Murmur, heart 10/13/2013    Past Surgical History  Procedure Laterality Date  . Abdominal hysterectomy  1946    partial  . Hip arthroplasty Left 10/14/2013    Procedure: ARTHROPLASTY BIPOLAR HIP;  Surgeon: Nadara MustardMarcus V Duda, MD;  Location: [facility];  Service: Orthopedics;  Laterality: Left;    Family History  Problem Relation Age of Onset  . Stroke Mother   . Other Father     accident    Social History   Social History  . Marital Status: Married    Spouse Name: N/A  . Number of Children: N/A  . Years of Education: N/A   Occupational History  . [employer] for [employer], then worked at [employer]     retired   Social History Main Topics  . Smoking status: Never Smoker   . Smokeless tobacco: Never Used  . Alcohol Use: No  . Drug Use: No  . Sexual Activity: No  Other Topics Concern  . Not on file   Social History Narrative   Has living will   Son is health care POA   Has DNR order   No tube feeds     Review of Systems Will episodically talk--but usually doesn't  Sleeps most of the time No skin breakdown    Objective:   Physical Exam  Constitutional:  Cachectic Not speaking today Fights exam a little  Neck: No thyromegaly present.  Cardiovascular: Normal rate.   Slightly irregular Loud blowing systolic mrumur  Pulmonary/Chest:  Decreased breath sounds but clear  Abdominal: Soft. She exhibits no distension. There is no tenderness.  Musculoskeletal: She exhibits no edema.  Lymphadenopathy:    She has no cervical adenopathy.  Neurological:  Increased tone Will hold hand but little volitional movement otherwise  Skin:    No ulcers  Psychiatric:  Mild agitation with exam          Assessment & Plan:

## 2016-02-18 NOTE — Assessment & Plan Note (Signed)
Hard to tell now Little functional movement Still giving the aspirin

## 2016-02-18 NOTE — Assessment & Plan Note (Signed)
Eating better but still doesn't seem to have gained any weight

## 2016-02-18 NOTE — Assessment & Plan Note (Signed)
Somewhat better They have reduced the dose of miralax

## 2016-02-18 NOTE — Assessment & Plan Note (Signed)
Has had further functional decline Continues on hospice

## 2016-02-18 NOTE — Assessment & Plan Note (Addendum)
Sugars higher Have restarted the lantus I asked them to check her 2-3 times a week fasting. Increase lantus by 2 units weekly till regularly under 180 fasting Strokes presumably due to diabetes among other pathology

## 2016-03-09 ENCOUNTER — Telehealth: Payer: Self-pay

## 2016-03-09 NOTE — Telephone Encounter (Signed)
Albin FellingCarla called back and I advised her that Dr Alphonsus SiasLetvak was out of the office this afternoon and I could not give her orders. She is going to check with the hospice doctor to see if he will give her orders to do it today. I will follow-up with her tomorrow

## 2016-03-09 NOTE — Telephone Encounter (Signed)
Carla nurse with [facility] left v/m; pts caregiver told Albin FellingCarla pt has very foul smelling urine; Carla request in and out catheterization for U/A and C&S.Please advise.

## 2016-03-10 NOTE — Telephone Encounter (Signed)
Please see what happened Malodorous urine is usually a sign of concentration or colonization--not infection. If they sent urine, okay---but generally, if she doesn't have fever, new delirium or specific urinary symptoms (like blood in urine), the first step is to try to increase fluids

## 2016-03-10 NOTE — Telephone Encounter (Signed)
She did receive orders from the Hospice doctor to do the in and out cath and for a cipro rx.

## 2016-04-06 ENCOUNTER — Telehealth: Payer: Self-pay | Admitting: Internal Medicine

## 2016-04-06 NOTE — Telephone Encounter (Signed)
[facility] from [place] [place] Having increasing dyspnea--- seen by her and Dr Gibson RampFeldman who happened to be in the area They both believe she has new left sided CHF (crackles without edema) Acute cardiac ischemia???  Will try furosemide 40mg  with repeat later today--then 40mg  daily Consider IM if doesn't respond roxanol for the dyspnea She will go out daily to monitor how she is doing

## 2016-05-05 ENCOUNTER — Encounter: Payer: Self-pay | Admitting: Internal Medicine

## 2016-05-05 ENCOUNTER — Ambulatory Visit: Payer: Medicare Other | Admitting: Internal Medicine

## 2016-05-05 VITALS — BP 122/60 | HR 40 | Resp 28

## 2016-05-05 DIAGNOSIS — E43 Unspecified severe protein-calorie malnutrition: Secondary | ICD-10-CM

## 2016-05-05 DIAGNOSIS — IMO0002 Reserved for concepts with insufficient information to code with codable children: Secondary | ICD-10-CM

## 2016-05-05 DIAGNOSIS — E118 Type 2 diabetes mellitus with unspecified complications: Secondary | ICD-10-CM

## 2016-05-05 DIAGNOSIS — G309 Alzheimer's disease, unspecified: Secondary | ICD-10-CM

## 2016-05-05 DIAGNOSIS — I5032 Chronic diastolic (congestive) heart failure: Secondary | ICD-10-CM | POA: Insufficient documentation

## 2016-05-05 DIAGNOSIS — E1165 Type 2 diabetes mellitus with hyperglycemia: Secondary | ICD-10-CM

## 2016-05-05 DIAGNOSIS — F0280 Dementia in other diseases classified elsewhere without behavioral disturbance: Secondary | ICD-10-CM

## 2016-05-05 DIAGNOSIS — Z794 Long term (current) use of insulin: Secondary | ICD-10-CM

## 2016-05-05 DIAGNOSIS — F39 Unspecified mood [affective] disorder: Secondary | ICD-10-CM

## 2016-05-05 NOTE — Assessment & Plan Note (Signed)
Seemed to be getting ready for active dying--then improved Still severe and total care Hospice to continue

## 2016-05-05 NOTE — Progress Notes (Signed)
Subjective:    Patient ID: Anne [name], female    DOB: 1916/05/26, 82 y.o.   MRN: [id number]  HPI Home visit for follow up of severe dementia Caregiver here and Julie--hospice RN  Seemed like she was ready to start actively dying--then rebounded some 3 days ago--started saying "mama mama mama, I'm sick"--her usual verbalizations Being fed pureed food---worn out by chewing anything more substantial Eats 25-50% of normal diet  Did review her apparent CHF spell Getting furosemide 20mg  daily  This is helping breathing--but she still gets spells of irregular (like fast breathing) Some periods of apnea in the past week sats down to 85% with this at times  Not as agitated in general Will get 1/2 alprazolam at night and rarely during the day Words are less common and generally slurred  Sugars have been up since CHF episode AM sugars usually over 300  Caregivers transfer her to chair daily in AM Occasionally stays in bed if groggy or off They can pick her up without a lift Incontinent B/B  Seemed to have increased reflux symptoms Omeprazole increased to bid and seems to be better  Current Outpatient Prescriptions on File Prior to Visit  Medication Sig Dispense Refill  . acetaminophen (TYLENOL) 650 MG CR tablet Take 650 mg by mouth 3 (three) times daily as needed.     Marland Kitchen. albuterol (2.5 MG/3ML) 0.083% NEBU 3 mL, albuterol (5 MG/ML) 0.5% NEBU 0.5 mL Inhale 2.5 mg into the lungs 4 (four) times daily as needed.     . ALPRAZolam (XANAX) 0.25 MG tablet Take 1 tablet (0.25 mg total) by mouth 3 (three) times daily as needed. Can give 1.5 tabs if very agitated 1 tablet 0  . [facility] ASPIRIN CHILD 81 MG chewable tablet TAKE 1 TABLET BY MOUTH DAILY**TO BE CRUSHED BY FAMILY** 36 tablet 0  . insulin glargine (LANTUS) 100 UNIT/ML injection Inject 10 Units into the skin daily.     . magnesium hydroxide (MILK OF MAGNESIA) 400 MG/5ML suspension Take 5 mLs by mouth daily as needed for mild constipation.     Marland Kitchen. morphine (ROXANOL) 20 MG/ML concentrated solution Take 5 mg by mouth every 2 (two) hours as needed for severe pain.    . polyethylene glycol (MIRALAX / GLYCOLAX) packet Take 17 g by mouth daily.     . traMADol (ULTRAM) 50 MG tablet Take 1 tablet (50 mg total) by mouth every 6 (six) hours as needed for moderate pain. 60 tablet 1   No current facility-administered medications on file prior to visit.    No Known Allergies  Past Medical History  Diagnosis Date  . Diabetes mellitus   . Alzheimer's dementia   . Thyroid disease   . Hypertension   . Murmur, heart 10/13/2013    Past Surgical History  Procedure Laterality Date  . Abdominal hysterectomy  1946    partial  . Hip arthroplasty Left 10/14/2013    Procedure: ARTHROPLASTY BIPOLAR HIP;  Surgeon: Nadara MustardMarcus V Duda, MD;  Location: [facility];  Service: Orthopedics;  Laterality: Left;    Family History  Problem Relation Age of Onset  . Stroke Mother   . Other Father     accident    Social History   Social History  . Marital Status: Married    Spouse Name: N/A  . Number of Children: N/A  . Years of Education: N/A   Occupational History  . [employer] for [employer], then worked at [employer]     retired  Social History Main Topics  . Smoking status: Never Smoker   . Smokeless tobacco: Never Used  . Alcohol Use: No  . Drug Use: No  . Sexual Activity: No   Other Topics Concern  . Not on file   Social History Narrative   Has living will   Son is health care POA   Has DNR order   No tube feeds    Review of Systems Sleeping most of the time---definite increase lately (18 hours or more per day) Does seem to have pain at times--- says "OH". Tylenol  seems to help--and gets the tramadol at bedtime Bowels usually still okay with miralax    Objective:   Physical Exam  Neck: No thyromegaly present.  Cardiovascular: Regular rhythm.  Exam reveals no gallop.   Slow rate Gr 3/6 systolic murmur (blowing)  Pulmonary/Chest:  No respiratory distress. She has no wheezes.  Bibasilar crackles  Abdominal: Soft.  ?slight tenderness  Lymphadenopathy:    She has no cervical adenopathy.  Neurological:  Moving all 4 extremities today Awake and looks at me--but not really engaged  Skin:  No skin breakdown          Assessment & Plan:

## 2016-05-05 NOTE — Assessment & Plan Note (Signed)
Eating a little again but still clearly losing weight Severe wasting

## 2016-05-05 NOTE — Assessment & Plan Note (Signed)
With further decline, not as agitated Still seems to benefit from the alprazolam at times

## 2016-05-05 NOTE — Assessment & Plan Note (Signed)
Had fluid overload which responded to furosemide Will continue this unless really stops drinking (then can make it prn)

## 2016-05-05 NOTE — Assessment & Plan Note (Signed)
Will increase the lantus to 15 units daily now--then 2 more units every 2 days till AM sugars are down below 200 for the most part

## 2016-05-13 ENCOUNTER — Telehealth: Payer: Self-pay | Admitting: Internal Medicine

## 2016-05-13 NOTE — Telephone Encounter (Signed)
I got the message from the nurse

## 2016-05-13 NOTE — Telephone Encounter (Signed)
°  Lucile CraterKarel called to say pt has passed away 05/09/2016 @ 2:48am

## 2016-06-08 DEATH — deceased

## 2016-06-24 ENCOUNTER — Other Ambulatory Visit: Payer: Self-pay | Admitting: Internal Medicine
